# Patient Record
Sex: Female | Born: 1980 | Race: Black or African American | Hispanic: No | Smoking: Former smoker
Health system: Southern US, Community
[De-identification: ages and names within clinical notes are randomized; demographics above are authoritative.]

## PROBLEM LIST (undated history)

## (undated) DIAGNOSIS — D219 Benign neoplasm of connective and other soft tissue, unspecified: Secondary | ICD-10-CM

## (undated) DIAGNOSIS — K5790 Diverticulosis of intestine, part unspecified, without perforation or abscess without bleeding: Secondary | ICD-10-CM

---

## 2010-03-12 HISTORY — PX: CHOLECYSTECTOMY: SHX55

## 2018-03-12 HISTORY — PX: ABDOMINOPLASTY: SUR9

## 2020-03-02 ENCOUNTER — Ambulatory Visit: Payer: Self-pay | Admitting: Family Medicine

## 2020-03-02 ENCOUNTER — Encounter: Payer: Self-pay | Admitting: Family Medicine

## 2020-03-02 ENCOUNTER — Other Ambulatory Visit: Payer: Self-pay

## 2020-03-02 VITALS — BP 121/69 | HR 77 | Temp 97.8°F | Resp 16 | Ht 61.0 in

## 2020-03-02 DIAGNOSIS — Z7689 Persons encountering health services in other specified circumstances: Secondary | ICD-10-CM

## 2020-03-02 DIAGNOSIS — E559 Vitamin D deficiency, unspecified: Secondary | ICD-10-CM

## 2020-03-02 DIAGNOSIS — Z9189 Other specified personal risk factors, not elsewhere classified: Secondary | ICD-10-CM

## 2020-03-02 DIAGNOSIS — Z124 Encounter for screening for malignant neoplasm of cervix: Secondary | ICD-10-CM

## 2020-03-02 DIAGNOSIS — Z1231 Encounter for screening mammogram for malignant neoplasm of breast: Secondary | ICD-10-CM

## 2020-03-02 NOTE — Patient Instructions (Addendum)
Thank you for coming to the office today.  STay tuned for women's health referral to local OBGYN - [facility] [address] [place], [place] [address] Hours: [place] Main: [place]   Address: [address], [place], [place], [place],  [address] Hours: 8AM-5PM Phone: [phone]  Return tomorrow for non fasting lab for Vitamin D and COVID antibody testing.    DUE for FASTING BLOOD WORK no food or drink after midnight before the lab appointment, only water or coffee without cream/sugar on the morning of)  SCHEDULE "Lab Only" visit in the morning at the clinic for lab draw in 2-3* MONTHS   - Make sure Lab Only appointment is at about 1 week before your next appointment, so that results will be available  For Lab Results, once available within 2-3 days of blood draw, you can can log in to MyChart online to view your results and a brief explanation. Also, we can discuss results at next follow-up visit.     Please schedule a Follow-up Appointment to: Return in about 2 months (around 05/03/2020) for return 1 day non fasting lab, then 2-3 months fasting lab only then 1 week later Annual Physical.  If you have any other questions or concerns, please feel free to call the office or send a message through [place]. You may also schedule an earlier appointment if necessary.  Additionally, you may be receiving a survey about your experience at our office within a few days to 1 week by e-mail or mail. We value your feedback.  Nobie Putnam, DO [place]

## 2020-03-02 NOTE — Progress Notes (Signed)
Subjective:    Patient ID: Kristina Parks, female    DOB: 27-Jun-1980, 39 y.o.   MRN: [id number]  Kristina Parks is a 39 y.o. female presenting on 03/02/2020 for Establish Care and Vitamin D Deficiency  Relocated to [place] from [place] in March 2021  HPI   History of COVID Exposure She married in March 2021 and they moved to [place], and her mother lives in [place]. Her husband got COVID and passed in October 2021. - She did not get sick with COVID. She tested 3 x and was negative. She asks about antibody testing. 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  Vitamin D Deficiency Previous lab 12/2018 She took Vitamin D3 50,000 weekly for 3 months, now on daily  Health Maintenance:  Cervical Cancer Screening: Last pap smear about 6 years ago. Normal result. Never had abnormal. No fam history. She has not had any GYN procedure except tubal ligation  Breast CA Screening: Due for mammogram screening. Last mammogram result 1 year ago age 75 in [place] it was negative. No prior history abnormal mammogram. No known family history of breast cancer. Currently asymptomatic.  Due for Flu Shot, declines today despite counseling on benefits   Depression screen [facility] 2/9 03/02/2020  Decreased Interest 0  Down, Depressed, Hopeless 0  PHQ - 2 Score 0    History reviewed. No pertinent past medical history.  The histories are not reviewed yet. Please review them in the "History" navigator section and refresh this SmartLink. Social History   Socioeconomic History  . Marital status: Widowed    Spouse name: Not on file  . Number of children: Not on file  . Years of education: Not on file  . Highest education level: Not on file  Occupational History  . Not on file  Tobacco Use  . Smoking status: Former Games developer  . Smokeless tobacco: Former Engineer, water and Sexual Activity  . Alcohol use: Never  . Drug use: Not Currently    Types: Marijuana  . Sexual activity: Not on  file  Other Topics Concern  . Not on file  Social History Narrative  . Not on file   Social Determinants of Health   Financial Resource Strain: Not on file  Food Insecurity: Not on file  Transportation Needs: Not on file  Physical Activity: Not on file  Stress: Not on file  Social Connections: Not on file  Intimate Partner Violence: Not on file   Family History  Problem Relation Age of Onset  . Diabetes Sister   . Hypertension Sister   . Diabetes Maternal Grandmother   . Stroke Maternal Grandmother   . Hypertension Maternal Grandmother   . Cervical cancer Neg Hx   . Breast cancer Neg Hx   . Colon cancer Neg Hx    Current Outpatient Medications on File Prior to Visit  Medication Sig  . Cholecalciferol (VITAMIN D3) 50 MCG (2000 UT) TABS Take 2,000 Units by mouth daily.   No current facility-administered medications on file prior to visit.    Review of Systems Per HPI unless specifically indicated above      Objective:    BP 121/69   Pulse 77   Temp 97.8 F (36.6 C) (Temporal)   Resp 16   Ht 5\' 1"  (1.549 m)   SpO2 100%   Wt Readings from Last 3 Encounters:  No data found for Wt    Physical Exam Vitals and nursing note  reviewed.  Constitutional:      General: She is not in acute distress.    Appearance: She is well-developed and well-nourished. She is not diaphoretic.     Comments: Well-appearing, comfortable, cooperative  HENT:     Head: Normocephalic and atraumatic.     Mouth/Throat:     Mouth: Oropharynx is clear and moist.  Eyes:     General:        Right eye: No discharge.        Left eye: No discharge.     Conjunctiva/sclera: Conjunctivae normal.  Cardiovascular:     Rate and Rhythm: Normal rate.  Pulmonary:     Effort: Pulmonary effort is normal.  Musculoskeletal:        General: No edema.  Skin:    General: Skin is warm and dry.     Findings: No erythema or rash.  Neurological:     Mental Status: She is alert and oriented to person,  place, and time.  Psychiatric:        Mood and Affect: Mood and affect normal.        Behavior: Behavior normal.     Comments: Well groomed, good eye contact, normal speech and thoughts    No results found for this or any previous visit.    Assessment & Plan:   Problem List Items Addressed This Visit   None   Visit Diagnoses    Vitamin D deficiency    -  Primary   Relevant Orders   VITAMIN D 25 Hydroxy (Vit-D Deficiency, Fractures)   Cervical cancer screening       Relevant Orders   Ambulatory referral to Obstetrics / Gynecology   Encounter for screening mammogram for malignant neoplasm of breast       Relevant Orders   Ambulatory referral to Obstetrics / Gynecology   Encounter to establish care with new doctor       At increased risk of exposure to COVID-19 virus       Relevant Orders   SARS-CoV-2 Semi-Quantitative Total Antibody, Spike      #Referral to GYN locally for establish care routine women's health screening cervical cancer screening, now overdue, and breast cancer screening with mammogram.  #History of covid exposure Unvaccinated, but has been closely exposed in past. Request antibody level - Check quant antibody  #Vit D deficiency Treated in past, currently maintenance dosing Will check Lab with upcoming test  No orders of the defined types were placed in this encounter.   Follow up plan: Return in about 2 months (around 05/03/2020) for return 1 day non fasting lab, then 2-3 months fasting lab only then 1 week later Annual Physical.   Future orders 2-3 month after next result  Nobie Putnam, [facility] 03/02/2020, 3:25 PM

## 2020-03-03 ENCOUNTER — Encounter: Payer: Self-pay | Admitting: Family Medicine

## 2020-03-03 ENCOUNTER — Other Ambulatory Visit: Payer: Self-pay

## 2020-03-07 ENCOUNTER — Encounter: Payer: Self-pay | Admitting: Family Medicine

## 2020-03-07 ENCOUNTER — Other Ambulatory Visit: Payer: Self-pay | Admitting: Family Medicine

## 2020-03-07 DIAGNOSIS — Z0000 Encounter for general adult medical examination without abnormal findings: Secondary | ICD-10-CM

## 2020-03-07 DIAGNOSIS — Z1159 Encounter for screening for other viral diseases: Secondary | ICD-10-CM

## 2020-03-09 LAB — SARS-COV-2 SEMI-QUANTITATIVE TOTAL ANTIBODY, SPIKE: SARS COV2 AB, Total Spike Semi QN: 11.6 U/mL — ABNORMAL HIGH (ref <0.8)

## 2020-03-09 LAB — VITAMIN D 25 HYDROXY (VIT D DEFICIENCY, FRACTURES): Vit D, 25-Hydroxy: 32 ng/mL (ref 30–100)

## 2020-03-14 ENCOUNTER — Encounter: Payer: Self-pay | Admitting: Obstetrics and Gynecology

## 2020-03-15 ENCOUNTER — Encounter: Payer: Self-pay | Admitting: Obstetrics and Gynecology

## 2020-03-15 ENCOUNTER — Other Ambulatory Visit (HOSPITAL_COMMUNITY)
Admission: RE | Admit: 2020-03-15 | Discharge: 2020-03-15 | Disposition: A | Discharge status: Home / Self Care | Payer: Self-pay | Source: Ambulatory Visit | Attending: Obstetrics and Gynecology | Admitting: Obstetrics and Gynecology

## 2020-03-15 ENCOUNTER — Ambulatory Visit (INDEPENDENT_AMBULATORY_CARE_PROVIDER_SITE_OTHER): Payer: Self-pay | Admitting: Obstetrics and Gynecology

## 2020-03-15 ENCOUNTER — Other Ambulatory Visit: Payer: Self-pay

## 2020-03-15 VITALS — BP 114/72 | Ht 61.0 in | Wt 201.4 lb

## 2020-03-15 DIAGNOSIS — Z1239 Encounter for other screening for malignant neoplasm of breast: Secondary | ICD-10-CM

## 2020-03-15 DIAGNOSIS — R102 Pelvic and perineal pain: Secondary | ICD-10-CM

## 2020-03-15 DIAGNOSIS — Z124 Encounter for screening for malignant neoplasm of cervix: Secondary | ICD-10-CM | POA: Insufficient documentation

## 2020-03-15 DIAGNOSIS — Z7185 Encounter for immunization safety counseling: Secondary | ICD-10-CM

## 2020-03-15 DIAGNOSIS — Z01419 Encounter for gynecological examination (general) (routine) without abnormal findings: Secondary | ICD-10-CM

## 2020-03-15 DIAGNOSIS — Z1231 Encounter for screening mammogram for malignant neoplasm of breast: Secondary | ICD-10-CM

## 2020-03-15 NOTE — Progress Notes (Signed)
Pt thinks she needs a Pap.

## 2020-03-15 NOTE — Progress Notes (Signed)
Gynecology Exam  PCP: Olin Hauser, DO  Chief Complaint:  Chief Complaint  Patient presents with  . Gynecologic Exam    Due for pap/mammogram     History of Present Illness: Patient is a 40 y.o. [id number] presents as a referral for pap/mammogram from PCP. Patient also reports a LLQ pelvic pain that she associates with her menstrual cycles. Patient states the pain begins immediately following her cycle and lasts for a week at a time, limiting her physical activity and mobility. Patient describes the pain as sharp and cramping. Patient denies any factor that improve the pain. Patient reports she use to experience painful cramping with periods years ago. In the last ten years she reports regular, 28 day cycles, with 3-4 days of light menstruation.  LMP: Patient's last menstrual period was 02/19/2020. Average Interval: regular, 28 days Duration of flow: 4 days Heavy Menses: no Clots: no Intermenstrual Bleeding: no Postcoital Bleeding: no Dysmenorrhea: no   The patient is sexually active. She currently is not utilizing any form of contraception (has not in fourteen years). She denies dyspareunia.  The patient. There is no notable family history of breast or ovarian cancer in her family.  The patient has regular exercise: yes.  7 days a week, lifting and cycling.  The patient denies current symptoms of depression.    Review of Systems: ROS  Past Medical History:  There are no problems to display for this patient.   Past Surgical History:  Past Surgical History:  Procedure Laterality Date  . ABDOMINOPLASTY  2020  . [place]   2003, 2007    Gynecologic History:  Patient's last menstrual period was 02/19/2020. Contraception: none Last Pap: About five years ago - denies hx of abn paps   Last mammogram: 2020 Results were: per patient "negative"  Obstetric History: [id number]  Family History:  Family History  Problem Relation Age of Onset  .  Diabetes Sister   . Hypertension Sister   . Diabetes Maternal Grandmother   . Stroke Maternal Grandmother   . Hypertension Maternal Grandmother   . Cervical cancer Neg Hx   . Breast cancer Neg Hx   . Colon cancer Neg Hx     Social History:  Social History   Socioeconomic History  . Marital status: Widowed    Spouse name: Not on file  . Number of children: Not on file  . Years of education: Not on file  . Highest education level: Not on file  Occupational History  . Not on file  Tobacco Use  . Smoking status: Former Smoker    Packs/day: 1.00    Years: 10.00    Pack years: 10.00    Types: Cigarettes  . Smokeless tobacco: Former Network engineer and Sexual Activity  . Alcohol use: Never  . Drug use: Not Currently    Types: Marijuana  . Sexual activity: Not on file  Other Topics Concern  . Not on file  Social History Narrative  . Not on file   Social Determinants of Health   Financial Resource Strain: Not on file  Food Insecurity: Not on file  Transportation Needs: Not on file  Physical Activity: Not on file  Stress: Not on file  Social Connections: Not on file  Intimate Partner Violence: Not on file    Allergies:  No Known Allergies  Medications: Prior to Admission medications   Medication Sig Start Date End Date Taking? Authorizing Provider  Cholecalciferol (VITAMIN D3) 50 MCG (  2000 UT) TABS Take 2,000 Units by mouth daily.    [provider]    Physical Exam Vitals: Blood pressure 114/72, height 5\' 1"  (1.549 m), weight 201 lb 6.4 oz (91.4 kg), last menstrual period 02/19/2020.  General: NAD HEENT: normocephalic, anicteric Cardiovascular: RRR, distal pulses 2+ Breast: Breast symmetrical, no tenderness, no palpable nodules or masses, no skin or nipple retraction present, no nipple discharge.  No axillary or supraclavicular lymphadenopathy. Abdomen: NABS, soft, non-tender, non-distended.  Umbilicus without lesions.  No hepatomegaly, splenomegaly or  masses palpable. No evidence of hernia  Genitourinary:  External: Normal external female genitalia.  Normal urethral meatus, normal Bartholin's and Skene's glands.    Vagina: Normal vaginal mucosa, no evidence of prolapse.    Cervix: Grossly normal in appearance, no bleeding  Uterus: Non-enlarged, mobile, normal contour.  No CMT  Adnexa: ovaries non-enlarged, no adnexal masses  Rectal: deferred Extremities: no edema, erythema, or tenderness Neurologic: Grossly intact Psychiatric: mood appropriate, affect full  Assessment: 40 y.o. 44 as referral for pap and mammogram. Patient with menstrual cycle associated left-sided pelvic pain.   Plan: Problem List Items Addressed This Visit   None   Visit Diagnoses    Screening for cervical cancer    -  Primary   Relevant Orders   Cytology - PAP   Immunization counseling       Screening mammogram for breast cancer       Relevant Orders   MM DIGITAL SCREENING BILATERAL   Encounter for gynecological examination without abnormal finding       Screening breast examination       Pelvic pain in female       Relevant Orders   [id number] PELVIS TRANSVAGINAL NON-OB (TV ONLY)      1) Mammogram - recommend yearly screening mammogram.  Mammogram Was ordered today - patient is self-pay and states she will schedule when she can.  2) ASCCP guidelines and rational discussed.  Patient opts for every 5 years screening interval.  3) Routine healthcare maintenance including cholesterol, diabetes screening discussed managed by PCP - Patient recently established with new PCP.  4) Patient report of L sided pelvic pain following cycles - bimanual exam wnl, offered TVUS (patient is self and pay will schedule when desires). Discussed utilizing of scheduled NSAIDs starting two days before the end of her menstural cycle and continuing for 72 hours to see if pain is relieved.    Return for Schedule pelvic US.  [place], CNM, MSN [facility], [facility] 03/15/2020, 10:41 AM

## 2020-03-18 LAB — CYTOLOGY - PAP
Comment: NEGATIVE
Diagnosis: NEGATIVE
High risk HPV: NEGATIVE

## 2020-04-15 ENCOUNTER — Ambulatory Visit: Payer: Self-pay

## 2020-04-15 DIAGNOSIS — R102 Pelvic and perineal pain: Secondary | ICD-10-CM

## 2020-04-22 ENCOUNTER — Ambulatory Visit: Payer: Self-pay | Admitting: Obstetrics and Gynecology

## 2020-04-22 ENCOUNTER — Ambulatory Visit: Payer: Self-pay

## 2020-06-06 ENCOUNTER — Other Ambulatory Visit: Payer: Self-pay | Admitting: *Deleted

## 2020-06-06 DIAGNOSIS — Z1159 Encounter for screening for other viral diseases: Secondary | ICD-10-CM

## 2020-06-06 DIAGNOSIS — Z0000 Encounter for general adult medical examination without abnormal findings: Secondary | ICD-10-CM

## 2020-06-07 ENCOUNTER — Other Ambulatory Visit: Payer: Self-pay

## 2020-06-14 ENCOUNTER — Encounter: Payer: Self-pay | Admitting: Family Medicine

## 2020-08-30 ENCOUNTER — Ambulatory Visit (INDEPENDENT_AMBULATORY_CARE_PROVIDER_SITE_OTHER): Payer: Self-pay | Admitting: Podiatry

## 2020-08-30 ENCOUNTER — Other Ambulatory Visit: Payer: Self-pay

## 2020-08-30 DIAGNOSIS — Z79899 Other long term (current) drug therapy: Secondary | ICD-10-CM

## 2020-08-30 DIAGNOSIS — B351 Tinea unguium: Secondary | ICD-10-CM

## 2020-08-31 ENCOUNTER — Encounter: Payer: Self-pay | Admitting: Podiatry

## 2020-08-31 LAB — HEPATIC FUNCTION PANEL
ALT: 9 IU/L (ref 0–32)
AST: 16 IU/L (ref 0–40)
Albumin: 4.1 g/dL (ref 3.8–4.8)
Alkaline Phosphatase: 43 IU/L — ABNORMAL LOW (ref 44–121)
Bilirubin Total: 0.4 mg/dL (ref 0.0–1.2)
Bilirubin, Direct: 0.11 mg/dL (ref 0.00–0.40)
Total Protein: 6.6 g/dL (ref 6.0–8.5)

## 2020-08-31 MED ORDER — TERBINAFINE HCL 250 MG PO TABS: 250.0000 mg | ORAL_TABLET | Freq: Every day | ORAL | 0 refills | Status: DC

## 2020-08-31 NOTE — Progress Notes (Signed)
  Subjective:  Patient ID: Kristina Parks, female    DOB: Jul 17, 1980,  MRN: [id number]  Chief Complaint  Patient presents with   Nail Problem    Nail discoloration     40 y.o. female presents with the above complaint.  Patient presents with complaint of thickened elongated dystrophic toenails x10.  Painful to touch.  She would like to discuss treatment for fungus.  She has not tried anything in the past.  She has not seen anyone else prior to seeing me.  She denies any liver history.  She would like to discuss treatment options for this.  They are painful sometimes is that thick they are harder to cut.   Review of Systems: Negative except as noted in the HPI. Denies N/V/F/Ch.  No past medical history on file.  Current Outpatient Medications:    terbinafine (LAMISIL) 250 MG tablet, Take 1 tablet (250 mg total) by mouth daily., Disp: 90 tablet, Rfl: 0   Cholecalciferol (VITAMIN D3) 50 MCG (2000 UT) TABS, Take 2,000 Units by mouth daily., Disp: , Rfl:   Social History   Tobacco Use  Smoking Status Former   Packs/day: 1.00   Years: 10.00   Pack years: 10.00   Types: Cigarettes  Smokeless Tobacco Former    No Known Allergies Objective:  There were no vitals filed for this visit. There is no height or weight on file to calculate BMI. Constitutional Well developed. Well nourished.  Vascular Dorsalis pedis pulses palpable bilaterally. Posterior tibial pulses palpable bilaterally. Capillary refill normal to all digits.  No cyanosis or clubbing noted. Pedal hair growth normal.  Neurologic Normal speech. Oriented to person, place, and time. Epicritic sensation to light touch grossly present bilaterally.  Dermatologic Nails thickened elongated dystrophic mycotic toenails x10.  Mild pain on palpation Skin within normal limits  Orthopedic: Normal joint ROM without pain or crepitus bilaterally. No visible deformities. No bony tenderness.   Radiographs: None Assessment:   1.  Long-term use of high-risk medication   2. Nail fungus   3. Onychomycosis due to dermatophyte    Plan:  Patient was evaluated and treated and all questions answered.  Bilateral onychomycosis x10 -Educated the patient on the etiology of onychomycosis and various treatment options associated with improving the fungal load.  I explained to the patient that there is 3 treatment options available to treat the onychomycosis including topical, p.o., laser treatment.  Patient elected to undergo p.o. options with Lamisil/terbinafine therapy.  In order for me to start the medication therapy, I explained to the patient the importance of evaluating the liver and obtaining the liver function test.  Once the liver function test comes back normal I will start him on 72-month course of Lamisil therapy.  Patient understood all risk and would like to proceed with Lamisil therapy.  I have asked the patient to immediately stop the Lamisil therapy if she has any reactions to it and call the office or go to the emergency room right away.  Patient states understanding   No follow-ups on file.

## 2020-09-02 ENCOUNTER — Telehealth: Payer: Self-pay | Admitting: *Deleted

## 2020-09-02 MED ORDER — TERBINAFINE HCL 250 MG PO TABS: 250.0000 mg | ORAL_TABLET | Freq: Every day | ORAL | 0 refills | Status: DC

## 2020-09-02 NOTE — Telephone Encounter (Signed)
"  I'm calling about my prescription for Lamisil.  I want it to be sent to [facility] over on [address].  It's very expensive at [facility] and much cheaper at [facility], more affordable.  So if you would, let me know when that's been done."  I'm calling to let you know that I sent your prescription for Lamisil to [place].  "Thank you so much."

## 2020-09-05 ENCOUNTER — Telehealth: Payer: Self-pay

## 2020-09-05 NOTE — Telephone Encounter (Signed)
Patient called today and left a message at 12:34 pm. She is having a reaction to the Lamisil. Yesterday she had some itching all over. Today the skin on her lips is peeling. Called and spoke with patient - advised to NOT take anymore Lamisil. Advised to take Benadryl if itching and peeling persisted. Patient asked if there is something topical she can use on her toenails instead.

## 2020-09-06 MED ORDER — CICLOPIROX 8 % EX SOLN: Freq: Every day | CUTANEOUS | 0 refills | Status: DC

## 2020-09-06 NOTE — Addendum Note (Signed)
Addended by: Boneta Lucks on: 09/06/2020 07:58 AM   Modules accepted: Orders

## 2020-11-28 ENCOUNTER — Telehealth: Payer: Self-pay | Admitting: Obstetrics & Gynecology

## 2020-11-28 NOTE — Telephone Encounter (Signed)
Kristina Parks placed an order for this pt to have an [place] on 03/15/20.  She was advised to try taking Tylenol, Advil, etc. To help with the pain and maybe the [place] would not be needed. She has decided now that she does need the [place].  Can you please place an order for an [place]?  Or would she need to be seen first?  Please advise.

## 2020-11-29 ENCOUNTER — Other Ambulatory Visit: Payer: Self-pay | Admitting: Obstetrics & Gynecology

## 2020-11-29 DIAGNOSIS — R102 Pelvic and perineal pain: Secondary | ICD-10-CM

## 2020-11-29 NOTE — Telephone Encounter (Signed)
Sch [place] then a follow up with one of the MD's. Order placed.

## 2020-12-06 ENCOUNTER — Other Ambulatory Visit: Payer: Self-pay

## 2020-12-06 ENCOUNTER — Ambulatory Visit
Admission: RE | Admit: 2020-12-06 | Discharge: 2020-12-06 | Disposition: A | Discharge status: Home / Self Care | Payer: Self-pay | Source: Ambulatory Visit | Attending: Obstetrics & Gynecology | Admitting: Obstetrics & Gynecology

## 2020-12-06 DIAGNOSIS — R102 Pelvic and perineal pain: Secondary | ICD-10-CM | POA: Insufficient documentation

## 2020-12-06 DIAGNOSIS — R1032 Left lower quadrant pain: Secondary | ICD-10-CM | POA: Diagnosis not present

## 2020-12-06 DIAGNOSIS — G8929 Other chronic pain: Secondary | ICD-10-CM | POA: Diagnosis not present

## 2020-12-12 ENCOUNTER — Other Ambulatory Visit: Payer: Self-pay

## 2020-12-12 ENCOUNTER — Ambulatory Visit (INDEPENDENT_AMBULATORY_CARE_PROVIDER_SITE_OTHER): Payer: Self-pay | Admitting: Obstetrics & Gynecology

## 2020-12-12 ENCOUNTER — Encounter: Payer: Self-pay | Admitting: Obstetrics & Gynecology

## 2020-12-12 VITALS — BP 120/80 | Ht 61.0 in | Wt 201.0 lb

## 2020-12-12 DIAGNOSIS — D252 Subserosal leiomyoma of uterus: Secondary | ICD-10-CM

## 2020-12-12 DIAGNOSIS — D219 Benign neoplasm of connective and other soft tissue, unspecified: Secondary | ICD-10-CM

## 2020-12-12 DIAGNOSIS — R102 Pelvic and perineal pain: Secondary | ICD-10-CM

## 2020-12-12 NOTE — Progress Notes (Signed)
HPI: Patient is a 40 yo G3P3 AA F, presents with recent US due to pelvic paiin and noted to have a uterine fibroid. Periods are regular every 28-30 days, lasting several days. Dysmenorrhea:moderate, occurring premenstrually and first 1-2 days of flow. Cyclic symptoms include none. No intermenstrual bleeding, spotting, or discharge.  She has had pain for about a year, mostly LLQ, worse w activity, menses, and sex.  She is a regular with exercise and yoga.  She has lost 150 lbs and continues to maintain weight loss w activity and diet.  The pain gets in the way of this routine at times.  Ultrasound demonstrates a small 2 cm mid uterine subserosal fibroid (see below, non-descript)  PMHx: She  has no past medical history on file. Also,  has a past surgical history that includes Cesarean section (1999) and Abdominoplasty (2020)., family history includes Diabetes in her maternal grandmother and sister; Hypertension in her maternal grandmother and sister; Stroke in her maternal grandmother.,  reports that she has quit smoking. Her smoking use included cigarettes. She has a 10.00 pack-year smoking history. She has quit using smokeless tobacco. She reports that she does not currently use drugs after having used the following drugs: Marijuana. She reports that she does not drink alcohol.  She has a current medication list which includes the following prescription(s): vitamin d3, ciclopirox, and terbinafine. Also, has No Known Allergies.  Review of Systems  Constitutional:  Negative for chills, fever and malaise/fatigue.  HENT:  Negative for congestion, sinus pain and sore throat.   Eyes:  Negative for blurred vision and pain.  Respiratory:  Negative for cough and wheezing.   Cardiovascular:  Negative for chest pain and leg swelling.  Gastrointestinal:  Positive for abdominal pain. Negative for constipation, diarrhea, heartburn, nausea and vomiting.  Genitourinary:  Negative for dysuria, frequency, hematuria  and urgency.  Musculoskeletal:  Negative for back pain, joint pain, myalgias and neck pain.  Skin:  Negative for itching and rash.  Neurological:  Negative for dizziness, tremors and weakness.  Endo/Heme/Allergies:  Does not bruise/bleed easily.  Psychiatric/Behavioral:  Negative for depression. The patient is not nervous/anxious and does not have insomnia.    Objective: BP 120/80   Ht 5\' 1"  (1.549 m)   Wt 201 lb (91.2 kg)   LMP 11/18/2020   BMI 37.98 kg/m   Physical examination Constitutional NAD, Conversant  Skin No rashes, lesions or ulceration.   Extremities: Moves all appropriately.  Normal ROM for age. No lymphadenopathy.  Neuro: Grossly intact  Psych: Oriented to PPT.  Normal mood. Normal affect.   TRANSABDOMINAL AND TRANSVAGINAL ULTRASOUND OF PELVIS   TECHNIQUE: Both transabdominal and transvaginal ultrasound examinations of the pelvis were performed. Transabdominal technique was performed for global imaging of the pelvis including uterus, ovaries, adnexal regions, and pelvic cul-de-sac. It was necessary to proceed with endovaginal exam following the transabdominal exam to visualize the adnexa.   COMPARISON:  None   FINDINGS: Uterus   Measurements: 11.6 x 5.4 x 6.0 cm = volume: 97 mL. Anteverted. Heterogeneous myometrium. No definite discrete mass. An area of slight contour abnormality at the anterior aspect of the mid uterus is seen, cannot completely exclude a subserosal 1.9 cm diameter leiomyoma.   Endometrium   Thickness: 5 mm.  No endometrial fluid or mass   Right ovary   Measurements: 3.1 x 2.2 x 2.5 cm = volume: 9.1 mL. Normal morphology without mass   Left ovary   Measurements: 2.8 x 2.4 x 2.1 cm =  volume: 7.4 mL. Normal morphology without mass   Other findings   Trace free pelvic fluid.  No adnexal masses.   IMPRESSION: Questionable 1.9 cm diameter anterior wall subserosal leiomyoma at mid uterus versus lobulated uterine contour.    Remainder of exam unremarkable.  Assessment: 1. Pelvic pain in female 2. Fibroids 3. Pelvic and perineal pain 4. Subserous leiomyoma of uterus - MR PELVIS WO CONTRAST; Future  Fibroid treatment such as [place], Lupron, Myomectomy, and Hysterectomy discussed in detail, with the pros and cons of each choice counseled.  No treatment as an option also discussed, as well as control of symptoms alone with hormone therapy. Information provided to the patient.  MRI to better characterize fibroid as well as surrounding structures.  Consider PT as an option to alleviate musculoskeletal component to her chronic LLQ pain  As fibroid is small, difficult to ascertain if it is the sole etiology for her pain.  May contribute, as uterus itself may contribute as well. Discussed that surgery such as myomectomy or hysterectomy may or may not solve her pain symptoms.  Reassured that no ovarian cysts or pathology seen  A total of 30 minutes were spent face-to-face with the patient as well as preparation, review, communication, and documentation during this encounter.   Barnett Applebaum, MD, [facility], [facility] 12/12/2020  2:04 PM

## 2020-12-12 NOTE — Patient Instructions (Signed)
Uterine Fibroids ?Uterine fibroids, also called leiomyomas, are noncancerous (benign) tumors that can grow in the uterus. They can cause heavy menstrual bleeding and pain. Fibroids may also grow in the fallopian tubes, cervix, or tissues (ligaments) near the uterus. ?You may have one or many fibroids. Fibroids vary in size, weight, and where they grow in the uterus. Some can become quite large. Most fibroids do not require medical treatment. ?What are the causes? ?The cause of this condition is not known. ?What increases the risk? ?You are more likely to develop this condition if you: ?Are in your 30s or 40s and have not gone through menopause. ?Have a family history of this condition. ?Are of African American descent. ?Started your menstrual period at age 10 or younger. ?Have never given birth. ?Are overweight or obese. ?What are the signs or symptoms? ?Many women do not have any symptoms. Symptoms of this condition may include: ?Heavy menstrual bleeding. ?Bleeding between menstrual periods. ?Pain and pressure in the pelvic area, between your hip bones. ?Pain during sex. ?Bladder problems, such as needing to urinate right away or more often than usual. ?Inability to have children (infertility). ?Failure to carry pregnancy to term (miscarriage). ?How is this diagnosed? ?This condition may be diagnosed based on: ?Your symptoms and medical history. ?A physical exam. ?A pelvic exam that includes feeling for any tumors. ?Imaging tests, such as ultrasound or MRI. ?How is this treated? ?Treatment for this condition may include follow-up visits with your health care provider to monitor your fibroids for any changes. Other treatment may include: ?Medicines, such as: ?Medicines to relieve pain, including aspirin and NSAIDs, such as ibuprofen or naproxen. ?Hormone therapy. Treatment may be given as a pill or an injection, or it may be inserted into the uterus using an intrauterine device (IUD). ?Surgery that would do one of  the following: ?Remove the fibroids (myomectomy). This may be recommended if fibroids affect your fertility and you want to become pregnant. ?Remove the uterus (hysterectomy). ?Block the blood supply to the fibroids (uterine artery embolization). This can cause them to shrink and die. ?Follow these instructions at home: ?Medicines ?Take over-the-counter and prescription medicines only as told by your health care provider. ?Ask your health care provider if you should take iron pills or eat more iron-rich foods, such as dark green, leafy vegetables. Heavy menstrual bleeding can cause low iron levels. ?Managing pain ?If directed, apply heat to your back or abdomen to reduce pain. Use the heat source that your health care provider recommends, such as a moist heat pack or a heating pad. To apply heat: ?Place a towel between your skin and the heat source. ?Leave the heat on for 20-30 minutes. ?Remove the heat if your skin turns bright red. This is especially important if you are unable to feel pain, heat, or cold. You may have a greater risk of getting burned. ? ?General instructions ?Pay close attention to your menstrual cycle. Tell your health care provider about any changes, such as: ?Heavier bleeding that requires you to change your pads or tampons more than usual. ?A change in the number of days that your menstrual period lasts. ?A change in symptoms that come with your menstrual period, such as back pain or cramps in your abdomen. ?Keep all follow-up visits. This is important, especially if your fibroids need to be monitored for any changes. ?Contact a health care provider if you: ?Have pelvic pain, back pain, or cramps in your abdomen that do not get better with medicine   or heat. ?Develop new bleeding between menstrual periods. ?Have increased bleeding during or between menstrual periods. ?Feel more tired or weak than usual. ?Feel light-headed. ?Get help right away if you: ?Faint. ?Have pelvic pain that suddenly  gets worse. ?Have severe vaginal bleeding that soaks a tampon or pad in 30 minutes or less. ?Summary ?Uterine fibroids are noncancerous (benign) tumors that can develop in the uterus. ?The exact cause of this condition is not known. ?Most fibroids do not require medical treatment unless they affect your ability to have children (fertility). ?Contact a health care provider if you have pelvic pain, back pain, or cramps in your abdomen that do not get better with medicines. ?Get help right away if you faint, have pelvic pain that suddenly gets worse, or have severe vaginal bleeding. ?This information is not intended to replace advice given to you by your health care provider. Make sure you discuss any questions you have with your health care provider. ?Document Revised: 09/29/2019 Document Reviewed: 09/29/2019 ?Elsevier Patient Education ? 2022 Elsevier Inc. ? ?

## 2020-12-18 ENCOUNTER — Ambulatory Visit: Admission: RE | Admit: 2020-12-18 | Payer: Self-pay | Source: Ambulatory Visit

## 2020-12-19 ENCOUNTER — Ambulatory Visit
Admission: RE | Admit: 2020-12-19 | Discharge: 2020-12-19 | Disposition: A | Discharge status: Home / Self Care | Payer: Medicaid Other | Source: Ambulatory Visit | Attending: Obstetrics & Gynecology | Admitting: Obstetrics & Gynecology

## 2020-12-19 ENCOUNTER — Other Ambulatory Visit: Payer: Self-pay

## 2020-12-19 ENCOUNTER — Ambulatory Visit: Payer: Self-pay | Admitting: Obstetrics & Gynecology

## 2020-12-19 DIAGNOSIS — R102 Pelvic and perineal pain: Secondary | ICD-10-CM | POA: Insufficient documentation

## 2020-12-19 DIAGNOSIS — D252 Subserosal leiomyoma of uterus: Secondary | ICD-10-CM | POA: Diagnosis not present

## 2020-12-19 DIAGNOSIS — K5730 Diverticulosis of large intestine without perforation or abscess without bleeding: Secondary | ICD-10-CM | POA: Diagnosis not present

## 2020-12-19 DIAGNOSIS — N858 Other specified noninflammatory disorders of uterus: Secondary | ICD-10-CM | POA: Diagnosis not present

## 2020-12-19 MED ORDER — GADOBUTROL 1 MMOL/ML IV SOLN
9.0000 mL | Freq: Once | INTRAVENOUS | Status: AC | PRN
  Administered 2020-12-19: 9 mL via INTRAVENOUS

## 2020-12-26 ENCOUNTER — Ambulatory Visit: Payer: Self-pay | Admitting: Obstetrics & Gynecology

## 2021-01-03 ENCOUNTER — Ambulatory Visit: Payer: Self-pay | Admitting: Podiatry

## 2021-05-09 ENCOUNTER — Ambulatory Visit
Admission: EM | Admit: 2021-05-09 | Discharge: 2021-05-09 | Disposition: A | Discharge status: Home / Self Care | Payer: Medicaid Other | Attending: Emergency Medicine | Admitting: Emergency Medicine

## 2021-05-09 ENCOUNTER — Encounter: Payer: Self-pay | Admitting: Emergency Medicine

## 2021-05-09 ENCOUNTER — Other Ambulatory Visit: Payer: Self-pay

## 2021-05-09 DIAGNOSIS — J0100 Acute maxillary sinusitis, unspecified: Secondary | ICD-10-CM

## 2021-05-09 DIAGNOSIS — Z20822 Contact with and (suspected) exposure to covid-19: Secondary | ICD-10-CM | POA: Diagnosis not present

## 2021-05-09 HISTORY — DX: Benign neoplasm of connective and other soft tissue, unspecified: D21.9

## 2021-05-09 HISTORY — DX: Diverticulosis of intestine, part unspecified, without perforation or abscess without bleeding: K57.90

## 2021-05-09 MED ORDER — FLUCONAZOLE 150 MG PO TABS: 150.0000 mg | ORAL_TABLET | Freq: Once | ORAL | 1 refills | Status: AC

## 2021-05-09 MED ORDER — AMOXICILLIN-POT CLAVULANATE 875-125 MG PO TABS: 1.0000 | ORAL_TABLET | Freq: Two times a day (BID) | ORAL | 0 refills | Status: DC

## 2021-05-09 MED ORDER — FLUTICASONE PROPIONATE 50 MCG/ACT NA SUSP: 2.0000 | Freq: Every day | NASAL | 0 refills | Status: DC

## 2021-05-09 NOTE — ED Provider Notes (Signed)
.HPI  SUBJECTIVE:  Kristina Parks is a 41 y.o. female who presents with 2 days of yellowish-green rhinorrhea, nasal congestion, bilateral ear pain, worse on the right, and decreased hearing.  She reports sinus pain and pressure, postnasal drip, sore throat secondary to postnasal drip and mild facial swelling/puffiness around her eyes.  States that her sense of taste is "off".  No change in her sense of smell.  No fevers, body aches, headaches, cough, shortness of breath, nausea, vomiting, diarrhea, abdominal pain.  No known COVID or flu exposure.  She did not get the COVID or flu vaccines.  She grinds her teeth at night and has a bite guard.  No recent swimming.  She uses ear buds occasionally.  No antipyretic in the past 6 hours.  She has been taking Tylenol and ibuprofen "around-the-clock" and took Benadryl last night for the nasal congestion without improvement in her symptoms.  Symptoms are worse when she tries to wear her bite guard, and with lying flat.  No antibiotics in the past month.  She has a past medical history of COVID in October 21 but no fibroids.  No history of TMJ arthralgia, frequent otitis media or sinusitis.  LMP: 1 week ago.  Denies possibility of being pregnant.  PCP: [facility]    Past Medical History:  Diagnosis Date   Diverticulosis    Fibroids     Past Surgical History:  Procedure Laterality Date   ABDOMINOPLASTY  2020   CESAREAN SECTION  1999   2003, 2007    Family History  Problem Relation Age of Onset   Diabetes Sister    Hypertension Sister    Diabetes Maternal Grandmother    Stroke Maternal Grandmother    Hypertension Maternal Grandmother    Cervical cancer Neg Hx    Breast cancer Neg Hx    Colon cancer Neg Hx     Social History   Tobacco Use   Smoking status: Former    Packs/day: 1.00    Years: 10.00    Pack years: 10.00    Types: Cigarettes   Smokeless tobacco: Former  Scientific laboratory technician Use: Never used  Substance Use  Topics   Alcohol use: Never   Drug use: Not Currently    Types: Marijuana    No current facility-administered medications for this encounter.  Current Outpatient Medications:    amoxicillin-clavulanate (AUGMENTIN) 875-125 MG tablet, Take 1 tablet by mouth 2 (two) times daily. X 7 days, Disp: 14 tablet, Rfl: 0   fluconazole (DIFLUCAN) 150 MG tablet, Take 1 tablet (150 mg total) by mouth once for 1 dose. 1 tab po x 1. May repeat in 72 hours if no improvement, Disp: 2 tablet, Rfl: 1   fluticasone (FLONASE) 50 MCG/ACT nasal spray, Place 2 sprays into both nostrils daily., Disp: 16 g, Rfl: 0  Allergies  Allergen Reactions   Lamisil [Terbinafine] Other (See Comments)    Skin peels     ROS  As noted in HPI.   Physical Exam  BP 117/78 (BP Location: Left Arm)    Pulse 80    Temp 98.3 F (36.8 C) (Oral)    Resp 18    LMP 05/02/2021    SpO2 98%   Constitutional: Well developed, well nourished, no acute distress Eyes:  EOMI, conjunctiva normal bilaterally HENT: Normocephalic, atraumatic,mucus membranes moist.  Hearing grossly intact and equal bilaterally.  Bilateral external ears, EACs TMs normal.  No pain with traction on pinna, palpation of  mastoid bilaterally.  Tenderness to palpation of right tragus.  No TMJ tenderness, jaw crepitus bilaterally.  Positive purulent nasal congestion.  Erythematous, swollen turbinates.  Positive maxillary sinus tenderness.  Positive postnasal drip. Neck: Shotty cervical lymphadenopathy. Respiratory: Normal inspiratory effort Cardiovascular: Normal rate GI: nondistended skin: No rash, skin intact Musculoskeletal: no deformities Neurologic: Alert & oriented x 3, no focal neuro deficits Psychiatric: Speech and behavior appropriate   ED Course   Medications - No data to display  Orders Placed This Encounter  Procedures   Novel Coronavirus, NAA ([facility])    Standing Status:   Standing    Number of Occurrences:   1    No results found for this or  any previous visit (from the past 24 hour(s)). No results found.  ED Clinical Impression  1. Acute non-recurrent maxillary sinusitis   2. Encounter for laboratory testing for COVID-19 virus      ED Assessment/Plan  Presentation consistent with maxillary sinusitis.  She has no evidence of an otitis media.  Not appear to be TMJ arthralgia.  Suspect the ear pain is coming from the nasal congestion.  Checking COVID as patient will qualify for antivirals because she is not vaccinated.  Will prescribe Molnupiravir if COVID is positive.  In the meantime, Flonase, saline nasal irrigation, Mucinex D.  She has soft indications for antibiotics- upper dental pain.  Wait-and-see prescription of Augmentin if not getting better with the other medications.  Diflucan because she gets frequent yeast infections with antibiotics.  Discussed labs,MDM, treatment plan, and plan for follow-up with patient. patient agrees with plan.   Meds ordered this encounter  Medications   fluticasone (FLONASE) 50 MCG/ACT nasal spray    Sig: Place 2 sprays into both nostrils daily.    Dispense:  16 g    Refill:  0   amoxicillin-clavulanate (AUGMENTIN) 875-125 MG tablet    Sig: Take 1 tablet by mouth 2 (two) times daily. X 7 days    Dispense:  14 tablet    Refill:  0   fluconazole (DIFLUCAN) 150 MG tablet    Sig: Take 1 tablet (150 mg total) by mouth once for 1 dose. 1 tab po x 1. May repeat in 72 hours if no improvement    Dispense:  2 tablet    Refill:  1      *This clinic note was created using [employer]. Therefore, there may be occasional mistakes despite careful proofreading.  ?    Melynda Ripple, MD 05/09/21 1454

## 2021-05-09 NOTE — ED Triage Notes (Signed)
Pt c/o bilateral ear pain and congestion x 3 days

## 2021-05-09 NOTE — Discharge Instructions (Addendum)
Start Mucinex-D to keep the mucous thin and to decongest you.   You may take 600 mg of motrin with 1000 mg of tylenol up to 3-4 times a day as needed for pain. This is an effective combination for pain.  Most sinus infections are viral and do not need antibiotics unless you have a high fever, have had this for 10 days, or you get better and then get sick again. Use a NeilMed sinus rinse with distilled water as often as you want to to reduce nasal congestion. Follow the directions on the box.   I will prescribe Molnupiravir if your COVID comes back positive.  You will get the results through [facility] or you can call here and get the result in a day or 2.   Go to www.goodrx.com to look up your medications. This will give you a list of where you can find your prescriptions at the most affordable prices. Or you can ask the pharmacist what the cash price is. This is frequently cheaper than going through insurance.

## 2021-05-11 LAB — NOVEL CORONAVIRUS, NAA: SARS-CoV-2, NAA: NOT DETECTED

## 2022-01-04 DIAGNOSIS — N898 Other specified noninflammatory disorders of vagina: Secondary | ICD-10-CM | POA: Diagnosis not present

## 2022-01-05 ENCOUNTER — Ambulatory Visit: Payer: Medicaid Other

## 2022-01-06 DIAGNOSIS — N390 Urinary tract infection, site not specified: Secondary | ICD-10-CM | POA: Diagnosis not present

## 2022-01-06 DIAGNOSIS — R350 Frequency of micturition: Secondary | ICD-10-CM | POA: Diagnosis not present

## 2022-03-01 ENCOUNTER — Ambulatory Visit: Payer: Self-pay | Admitting: Family Medicine

## 2022-03-07 ENCOUNTER — Ambulatory Visit (INDEPENDENT_AMBULATORY_CARE_PROVIDER_SITE_OTHER): Payer: Medicaid Other | Admitting: Family Medicine

## 2022-03-07 ENCOUNTER — Encounter: Payer: Self-pay | Admitting: Family Medicine

## 2022-03-07 VITALS — BP 102/70 | HR 87 | Resp 15 | Ht 61.0 in | Wt 181.6 lb

## 2022-03-07 DIAGNOSIS — Z83438 Family history of other disorder of lipoprotein metabolism and other lipidemia: Secondary | ICD-10-CM

## 2022-03-07 DIAGNOSIS — L658 Other specified nonscarring hair loss: Secondary | ICD-10-CM | POA: Diagnosis not present

## 2022-03-07 DIAGNOSIS — L659 Nonscarring hair loss, unspecified: Secondary | ICD-10-CM

## 2022-03-07 DIAGNOSIS — Z0000 Encounter for general adult medical examination without abnormal findings: Secondary | ICD-10-CM

## 2022-03-07 DIAGNOSIS — E559 Vitamin D deficiency, unspecified: Secondary | ICD-10-CM | POA: Diagnosis not present

## 2022-03-07 DIAGNOSIS — K5900 Constipation, unspecified: Secondary | ICD-10-CM

## 2022-03-07 DIAGNOSIS — Z833 Family history of diabetes mellitus: Secondary | ICD-10-CM | POA: Diagnosis not present

## 2022-03-07 DIAGNOSIS — Z131 Encounter for screening for diabetes mellitus: Secondary | ICD-10-CM | POA: Diagnosis not present

## 2022-03-07 DIAGNOSIS — Z13220 Encounter for screening for lipoid disorders: Secondary | ICD-10-CM | POA: Diagnosis not present

## 2022-03-07 DIAGNOSIS — Z1231 Encounter for screening mammogram for malignant neoplasm of breast: Secondary | ICD-10-CM | POA: Diagnosis not present

## 2022-03-07 DIAGNOSIS — E669 Obesity, unspecified: Secondary | ICD-10-CM | POA: Diagnosis not present

## 2022-03-07 NOTE — Patient Instructions (Addendum)
Thank you for coming to the office today.  For Constipation (less frequent bowel movement that can be hard dry or involve straining).  Recommend trying OTC Miralax 17g = 1 capful in large glass water once daily for now, try several days to see if working, goal is soft stool or BM 1-2 times daily, if too loose then reduce dose or try every other day. If not effective may need to increase it to 2 doses at once in AM or may do 1 in morning and 1 in afternoon/evening  - This medicine is very safe and can be used often without any problem and will not make you dehydrated. It is good for use on AS NEEDED BASIS or even MAINTENANCE therapy for longer term for several days to weeks at a time to help regulate bowel movements  Other more natural remedies or preventative treatment: - Increase hydration with water - Increase fiber in diet (high fiber foods = vegetables, leafy greens, oats/grains) - May take OTC Fiber supplement (metamucil powder or pill/gummy) - May try OTC Probiotic  --------------  Lab orders today.  For Mammogram screening for breast cancer   Call the [facility] below anytime to schedule your own appointment now that order has been placed.  [facility] Out[facility] Radiology [address] [place], [place] [address] Phone: [phone]  [facility] [place], [address] [place], [place] [address] Phone: [phone]  Referral submitted  [facility]   [place], [place] [address] Hours: 8AM-5PM Phone: [phone]  ----------  [facility] Dermatologist in [place], [place] Address: [address], [place], [place] [address] Phone: [phone]  [facility] [place], [place] [address] Phone: [phone]    DUE for FASTING BLOOD WORK (no food or drink after midnight before the lab appointment, only water or coffee without  cream/sugar on the morning of)  SCHEDULE "Lab Only" visit in the morning at the clinic for lab draw in 1 YEAR  - Make sure Lab Only appointment is at about 1 week before your next appointment, so that results will be available  For Lab Results, once available within 2-3 days of blood draw, you can can log in to MyChart online to view your results and a brief explanation. Also, we can discuss results at next follow-up visit.   Please schedule a Follow-up Appointment to: Return in about 1 year (around 03/08/2023) for 1 year Annual Physical AM apt fasting lab after.  If you have any other questions or concerns, please feel free to call the office or send a message through [facility]. You may also schedule an earlier appointment if necessary.  Additionally, you may be receiving a survey about your experience at our office within a few days to 1 week by e-mail or mail. We value your feedback.  Nobie Putnam, DO [facility]

## 2022-03-07 NOTE — Progress Notes (Signed)
Subjective:    Patient ID: Kristina Parks, female    DOB: [birth year], 41 y.o.   MRN: [id number]  Kristina Parks is a 41 y.o. female presenting on 03/07/2022 for Annual Exam and Constipation   HPI  Here for Annual Physical and Lab orders fasting today. Last visit with me 2021, now re-establishing care   BMI >34 Lifestyle She has improved diet, now following anti inflammatory diet and has had success with 20 lb weight loss in past 1 year. Exercise regimen with strength training, walking, cyclist  Constipation She has improved fiber intake, added daily mag citrate. Taking Probiotic. She feels bloating and some lower abdominal cramping and indigestion Bowel movements are smaller pieces, sometimes harder dry firm Usually week before her menstrual cycle will go several days without BM, then will continually nursing the constipation, seems 1 week will have successful BMs then will have 2-3 weeks with difficulty. She admits this is a longer term problem for her. She will use OTC Castor Oil to help resolve the constipation.  Fam history of Diabetes, HLD, Thyroid She is requesting screening labs  Followed by [facility]  Health Maintenance: Prior HIV screening 11/2021, negative  UTD Flu Vaccine 12/2021  Declines Hep C screen  Last Mammogram 2020 in [facility]      03/07/2022    9:09 AM 03/02/2020    3:17 PM  Depression screen PHQ 2/9  Decreased Interest 0 0  Down, Depressed, Hopeless 0 0  PHQ - 2 Score 0 0  Altered sleeping 0   Tired, decreased energy 0   Change in appetite 0   Feeling bad or failure about yourself  0   Trouble concentrating 0   Moving slowly or fidgety/restless 0   Suicidal thoughts 0   PHQ-9 Score 0   Difficult doing work/chores Not difficult at all     Past Medical History:  Diagnosis Date   Diverticulosis    Fibroids    Past Surgical History:  Procedure Laterality Date   ABDOMINOPLASTY  2020   CESAREAN SECTION  1999   2003, 2007    Social History   Socioeconomic History   Marital status: Widowed    Spouse name: Not on file   Number of children: Not on file   Years of education: Not on file   Highest education level: Not on file  Occupational History   Not on file  Tobacco Use   Smoking status: Former    Packs/day: 1.00    Years: 10.00    Total pack years: 10.00    Types: Cigarettes   Smokeless tobacco: Former  Scientific laboratory technician Use: Never used  Substance and Sexual Activity   Alcohol use: Never   Drug use: Not Currently    Types: Marijuana   Sexual activity: Not on file  Other Topics Concern   Not on file  Social History Narrative   Not on file   Social Determinants of Health   Financial Resource Strain: Not on file  Food Insecurity: Not on file  Transportation Needs: Not on file  Physical Activity: Not on file  Stress: Not on file  Social Connections: Not on file  Intimate Partner Violence: Not on file   Family History  Problem Relation Age of Onset   Diabetes Sister    Hypertension Sister    Diabetes Maternal Grandmother    Stroke Maternal Grandmother    Hypertension Maternal Grandmother    Cervical cancer Neg Hx    Breast cancer Neg  Hx    Colon cancer Neg Hx    No current outpatient medications on file prior to visit.   No current facility-administered medications on file prior to visit.    Review of Systems  Constitutional:  Negative for activity change, appetite change, chills, diaphoresis, fatigue and fever.  HENT:  Negative for congestion and hearing loss.   Eyes:  Negative for visual disturbance.  Respiratory:  Negative for cough, chest tightness, shortness of breath and wheezing.   Cardiovascular:  Negative for chest pain, palpitations and leg swelling.  Gastrointestinal:  Positive for constipation. Negative for abdominal pain, diarrhea, nausea and vomiting.  Genitourinary:  Negative for dysuria, frequency and hematuria.  Musculoskeletal:  Negative for arthralgias  and neck pain.  Skin:  Negative for rash.       Hair loss  Neurological:  Negative for dizziness, weakness, light-headedness, numbness and headaches.  Hematological:  Negative for adenopathy.  Psychiatric/Behavioral:  Negative for behavioral problems, dysphoric mood and sleep disturbance.    Per HPI unless specifically indicated above      Objective:    BP 102/70 (BP Location: Right Arm, Patient Position: Sitting, Cuff Size: Normal)   Pulse 87   Resp 15   Ht '5\' 1"'$  (1.549 m)   Wt 181 lb 9.6 oz (82.4 kg)   SpO2 98%   BMI 34.31 kg/m   Wt Readings from Last 3 Encounters:  03/07/22 181 lb 9.6 oz (82.4 kg)  12/12/20 201 lb (91.2 kg)  03/15/20 201 lb 6.4 oz (91.4 kg)    Physical Exam Vitals and nursing note reviewed.  Constitutional:      General: She is not in acute distress.    Appearance: She is well-developed. She is not diaphoretic.     Comments: Well-appearing, comfortable, cooperative  HENT:     Head: Normocephalic and atraumatic.  Eyes:     General:        Right eye: No discharge.        Left eye: No discharge.     Conjunctiva/sclera: Conjunctivae normal.     Pupils: Pupils are equal, round, and reactive to light.  Neck:     Thyroid: No thyromegaly.  Cardiovascular:     Rate and Rhythm: Normal rate and regular rhythm.     Pulses: Normal pulses.     Heart sounds: Normal heart sounds. No murmur heard. Pulmonary:     Effort: Pulmonary effort is normal. No respiratory distress.     Breath sounds: Normal breath sounds. No wheezing or rales.  Abdominal:     General: Bowel sounds are normal. There is no distension.     Palpations: Abdomen is soft. There is no mass.     Tenderness: There is no abdominal tenderness.  Musculoskeletal:        General: No tenderness. Normal range of motion.     Cervical back: Normal range of motion and neck supple.     Comments: Upper / Lower Extremities: - Normal muscle tone, strength bilateral upper extremities 5/5, lower extremities  5/5  Lymphadenopathy:     Cervical: No cervical adenopathy.  Skin:    General: Skin is warm and dry.     Findings: No erythema or rash.     Comments: Hair loss non scarring central scalp forehead, mostly crown of head  Neurological:     Mental Status: She is alert and oriented to person, place, and time.     Comments: Distal sensation intact to light touch all extremities  Psychiatric:  Mood and Affect: Mood normal.        Behavior: Behavior normal.        Thought Content: Thought content normal.     Comments: Well groomed, good eye contact, normal speech and thoughts       Results for orders placed or performed during the hospital encounter of 05/09/21  Novel Coronavirus, NAA ([facility])   Specimen: Nasopharyngeal Swab; Nasopharyngeal(NP) swabs in vial transport medium   Nasopharynge  Result Value Ref Range   SARS-CoV-2, NAA Not Detected Not Detected      Assessment & Plan:   Problem List Items Addressed This Visit   None Visit Diagnoses     Annual physical exam    -  Primary   Relevant Orders   COMPLETE METABOLIC PANEL WITH GFR   CBC with Differential/Platelet   Lipid panel   Hemoglobin A1c   TSH   Obesity (BMI 30.0-34.9)       Relevant Orders   COMPLETE METABOLIC PANEL WITH GFR   CBC with Differential/Platelet   Lipid panel   Hemoglobin A1c   TSH   Family history of hyperlipidemia       Relevant Orders   Lipid panel   Family history of diabetes mellitus (DM)       Relevant Orders   Hemoglobin A1c   Screening for diabetes mellitus (DM)       Relevant Orders   Hemoglobin A1c   Lipid screening       Relevant Orders   Lipid panel   Encounter for screening mammogram for malignant neoplasm of breast       Relevant Orders   MM 3D SCREEN BREAST BILATERAL   Vitamin D deficiency       Relevant Orders   VITAMIN D 25 Hydroxy (Vit-D Deficiency, Fractures)   Constipation, unspecified constipation type       Female pattern hair loss       Relevant Orders    Ambulatory referral to Dermatology   Hair loss       Relevant Orders   Ambulatory referral to Dermatology       Updated Health Maintenance information Fasting lab orders today, pending results. Encouraged improvement to lifestyle with diet and exercise Goal of weight loss  Constipation IBS-C symptoms based on history Associated with abdominal pain and cramping Reviewed symptom relief medications Consider miralax daily low dose for maintenance  Vit D Deficiency Check lab Follow up treatment recommendations.  Mammogram ordered. [facility], she will request outside record prior mammo  Female Pattern Hair Loss significant amount of female patter hair loss on top of scalp primarily, non scarring, admits stressors and other factors for hair loss. Requesting further management   Orders Placed This Encounter  Procedures   MM 3D SCREEN BREAST BILATERAL    Standing Status:   Future    Standing Expiration Date:   03/08/2023    Order Specific Question:   Reason for Exam (SYMPTOM  OR DIAGNOSIS REQUIRED)    Answer:   Screening bilateral 3D Mammogram [place]    Order Specific Question:   Preferred imaging location?    Answer:   [facility]   COMPLETE METABOLIC PANEL WITH GFR   CBC with Differential/Platelet   Lipid panel    Order Specific Question:   Has the patient fasted?    Answer:   Yes   Hemoglobin A1c   TSH   VITAMIN D 25 Hydroxy (Vit-D Deficiency, Fractures)   Ambulatory referral to Dermatology  Referral Priority:   Routine    Referral Type:   Consultation    Referral Reason:   Specialty Services Required    Requested Specialty:   Dermatology    Number of Visits Requested:   1      No orders of the defined types were placed in this encounter.     Follow up plan: Return in about 1 year (around 03/08/2023) for 1 year Annual Physical AM apt fasting lab after.   Nobie Putnam, [facility] 03/07/2022,  9:19 AM

## 2022-03-08 LAB — COMPLETE METABOLIC PANEL WITH GFR
AG Ratio: 1.5 (calc) (ref 1.0–2.5)
ALT: 13 U/L (ref 6–29)
AST: 20 U/L (ref 10–30)
Albumin: 4 g/dL (ref 3.6–5.1)
Alkaline phosphatase (APISO): 35 U/L (ref 31–125)
BUN: 13 mg/dL (ref 7–25)
CO2: 27 mmol/L (ref 20–32)
Calcium: 9 mg/dL (ref 8.6–10.2)
Chloride: 104 mmol/L (ref 98–110)
Creat: 0.62 mg/dL (ref 0.50–0.99)
Globulin: 2.6 g/dL (calc) (ref 1.9–3.7)
Glucose, Bld: 81 mg/dL (ref 65–99)
Potassium: 4 mmol/L (ref 3.5–5.3)
Sodium: 140 mmol/L (ref 135–146)
Total Bilirubin: 0.6 mg/dL (ref 0.2–1.2)
Total Protein: 6.6 g/dL (ref 6.1–8.1)
eGFR: 113 mL/min/{1.73_m2} (ref >=60)

## 2022-03-08 LAB — LIPID PANEL
Cholesterol: 148 mg/dL (ref <200)
HDL: 51 mg/dL (ref >=50)
LDL Cholesterol (Calc): 84 mg/dL (calc)
Non-HDL Cholesterol (Calc): 97 mg/dL (calc) (ref <130)
Total CHOL/HDL Ratio: 2.9 (calc) (ref <5.0)
Triglycerides: 57 mg/dL (ref <150)

## 2022-03-08 LAB — CBC WITH DIFFERENTIAL/PLATELET
Absolute Monocytes: 267 cells/uL (ref 200–950)
Basophils Absolute: 22 cells/uL (ref 0–200)
Basophils Relative: 0.5 %
Eosinophils Absolute: 22 cells/uL (ref 15–500)
Eosinophils Relative: 0.5 %
HCT: 36.5 % (ref 35.0–45.0)
Hemoglobin: 12.1 g/dL (ref 11.7–15.5)
Lymphs Abs: 1432 cells/uL (ref 850–3900)
MCH: 30.6 pg (ref 27.0–33.0)
MCHC: 33.2 g/dL (ref 32.0–36.0)
MCV: 92.2 fL (ref 80.0–100.0)
MPV: 10.1 fL (ref 7.5–12.5)
Monocytes Relative: 6.2 %
Neutro Abs: 2559 cells/uL (ref 1500–7800)
Neutrophils Relative %: 59.5 %
Platelets: 303 10*3/uL (ref 140–400)
RBC: 3.96 10*6/uL (ref 3.80–5.10)
RDW: 12.5 % (ref 11.0–15.0)
Total Lymphocyte: 33.3 %
WBC: 4.3 10*3/uL (ref 3.8–10.8)

## 2022-03-08 LAB — HEMOGLOBIN A1C
Hgb A1c MFr Bld: 5.4 % of total Hgb (ref <5.7)
Mean Plasma Glucose: 108 mg/dL
eAG (mmol/L): 6 mmol/L

## 2022-03-08 LAB — TSH: TSH: 1.26 mIU/L

## 2022-03-08 LAB — VITAMIN D 25 HYDROXY (VIT D DEFICIENCY, FRACTURES): Vit D, 25-Hydroxy: 41 ng/mL (ref 30–100)

## 2022-03-27 ENCOUNTER — Ambulatory Visit
Admission: RE | Admit: 2022-03-27 | Discharge: 2022-03-27 | Disposition: A | Discharge status: Home / Self Care | Payer: Medicaid Other | Source: Ambulatory Visit | Attending: Family Medicine | Admitting: Family Medicine

## 2022-03-27 DIAGNOSIS — Z1231 Encounter for screening mammogram for malignant neoplasm of breast: Secondary | ICD-10-CM | POA: Diagnosis not present

## 2022-04-02 ENCOUNTER — Other Ambulatory Visit: Payer: Self-pay | Admitting: *Deleted

## 2022-04-02 ENCOUNTER — Inpatient Hospital Stay
Admission: RE | Admit: 2022-04-02 | Discharge: 2022-04-02 | Disposition: A | Discharge status: Home / Self Care | Payer: Self-pay | Source: Ambulatory Visit | Attending: *Deleted | Admitting: *Deleted

## 2022-04-02 DIAGNOSIS — Z1231 Encounter for screening mammogram for malignant neoplasm of breast: Secondary | ICD-10-CM

## 2022-04-04 ENCOUNTER — Encounter: Payer: Self-pay | Admitting: Family Medicine

## 2022-04-04 ENCOUNTER — Ambulatory Visit
Admission: RE | Admit: 2022-04-04 | Discharge: 2022-04-04 | Disposition: A | Discharge status: Home / Self Care | Payer: Medicaid Other | Source: Ambulatory Visit | Attending: Family Medicine | Admitting: Family Medicine

## 2022-04-04 ENCOUNTER — Ambulatory Visit (INDEPENDENT_AMBULATORY_CARE_PROVIDER_SITE_OTHER): Payer: Medicaid Other | Admitting: Family Medicine

## 2022-04-04 ENCOUNTER — Ambulatory Visit
Admission: RE | Admit: 2022-04-04 | Discharge: 2022-04-04 | Disposition: A | Discharge status: Home / Self Care | Payer: Medicaid Other | Attending: Family Medicine | Admitting: Family Medicine

## 2022-04-04 VITALS — BP 128/64 | HR 58 | Ht 61.0 in | Wt 182.6 lb

## 2022-04-04 DIAGNOSIS — K5900 Constipation, unspecified: Secondary | ICD-10-CM

## 2022-04-04 DIAGNOSIS — R198 Other specified symptoms and signs involving the digestive system and abdomen: Secondary | ICD-10-CM | POA: Diagnosis not present

## 2022-04-04 NOTE — Patient Instructions (Addendum)
Thank you for coming to the office today.  Possible constipation, stool can be in rectal vault with incomplete emptying.  Also can be internal hemorrhoid blocking  Or can be external hemorrhoid on rectal surface causing pain and swelling pressure.  X-ray today will evaluate the stool / constipation.  If there is large build up you may try even higher dose miralax clean out up to 4 caps per dose.  If it is an external hemorrhoid - recommend OTC creams like Preparation H and Witch Hazel wipes, to treat flares and symptoms.  If it is internal hemorrhoid we can refer to GI - through [facility]) [address], [place] [address] Phone: [phone]  Please schedule a Follow-up Appointment to: Return if symptoms worsen or fail to improve.  If you have any other questions or concerns, please feel free to call the office or send a message through [place]. You may also schedule an earlier appointment if necessary.  Additionally, you may be receiving a survey about your experience at our office within a few days to 1 week by e-mail or mail. We value your feedback.  Kristina Putnam, DO [place]

## 2022-04-04 NOTE — Progress Notes (Signed)
Subjective:    Patient ID: Kristina Parks, female    DOB: 04-14-1980, 42 y.o.   MRN: [id number]  Kristina Parks is a 42 y.o. female presenting on 04/04/2022 for rectal pressure   HPI  Rectal Pressure / Pain  Reports symptoms onset last 2-3 weeks, this week worsening. She reports some rectal / anal pressure and some mild to moderate pain 4 out of 10, and she took ibuprofen - She feels this pain pressure while sitting. Also this week she feels it worse with working out while on stationary bike or strength training low back exercises. - Does not feel like bowel movement pressure  She admits about 1 bowel movement per day. She did have some chronic constipation See previous discussion with constipation at last visit.  She says even after bowel movement she still feel pressure in rectal area.  She will usually have sporadic bowel movements, some days only small pieces and not large solid amount.  She used Miralax 1 cap daily for a few days. She also did 1 week of 3 caps per day, 2 in AM and 1 in PM with success of bowel movement but still had symptoms.  Tried OTC glycerin suppository enema and it was not successful. It caused pain and felt like something was blocking.  Possible hemorrhoids she said but never diagnosed.  Past history on constipation She has improved fiber intake, added daily mag citrate. Taking Probiotic. History of bloating and some lower abdominal cramping and indigestion Usually week before her menstrual cycle will go several days without BM, then will continually nursing the constipation, seems 1 week will have successful BMs then will have 2-3 weeks with difficulty.  She admits this is a longer term problem for her.  She will use OTC Castor Oil to help resolve the constipation.      03/07/2022    9:09 AM 03/02/2020    3:17 PM  Depression screen PHQ 2/9  Decreased Interest 0 0  Down, Depressed, Hopeless 0 0  PHQ - 2 Score 0 0  Altered sleeping 0    Tired, decreased energy 0   Change in appetite 0   Feeling bad or failure about yourself  0   Trouble concentrating 0   Moving slowly or fidgety/restless 0   Suicidal thoughts 0   PHQ-9 Score 0   Difficult doing work/chores Not difficult at all     Social History   Tobacco Use   Smoking status: Former    Packs/day: 1.00    Years: 10.00    Total pack years: 10.00    Types: Cigarettes   Smokeless tobacco: Former  Scientific laboratory technician Use: Never used  Substance Use Topics   Alcohol use: Never   Drug use: Not Currently    Types: Marijuana    Review of Systems Per HPI unless specifically indicated above     Objective:    BP 128/64   Pulse (!) 58   Ht '5\' 1"'$  (1.549 m)   Wt 182 lb 9.6 oz (82.8 kg)   LMP 03/27/2022   SpO2 99%   BMI 34.50 kg/m   Wt Readings from Last 3 Encounters:  04/04/22 182 lb 9.6 oz (82.8 kg)  03/07/22 181 lb 9.6 oz (82.4 kg)  12/12/20 201 lb (91.2 kg)    Physical Exam Vitals and nursing note reviewed.  Constitutional:      General: She is not in acute distress.    Appearance: Normal appearance. She is well-developed. She  is not diaphoretic.     Comments: Well-appearing, comfortable, cooperative  HENT:     Head: Normocephalic and atraumatic.  Eyes:     General:        Right eye: No discharge.        Left eye: No discharge.     Conjunctiva/sclera: Conjunctivae normal.  Cardiovascular:     Rate and Rhythm: Normal rate.  Pulmonary:     Effort: Pulmonary effort is normal.  Genitourinary:    Comments: External rectal exam performed today. Identified large skin tag / hemorrhoidal tissue externally at 6 o clock. No inflammation. Non tender. No fissure seen. No protruding internal hemorrhoid. No bleeding.  Chaperoned by Loni Muse CMA Skin:    General: Skin is warm and dry.     Findings: No erythema or rash.  Neurological:     Mental Status: She is alert and oriented to person, place, and time.  Psychiatric:        Mood and Affect:  Mood normal.        Behavior: Behavior normal.        Thought Content: Thought content normal.     Comments: Well groomed, good eye contact, normal speech and thoughts    I have personally reviewed the radiology report from 04/04/22 on [facility] STAT.  CLINICAL DATA:  Rectal pain and pressure with constipation for months.   EXAM: ABDOMEN - 1 VIEW   COMPARISON:  None Available.   FINDINGS: Two supine views of the abdomen are submitted. Mildly prominent stool throughout the colon. The rectum is not distended. No supine evidence of bowel wall thickening or pneumoperitoneum. Cholecystectomy clips are noted. There are bilateral pelvic calcifications which are likely phleboliths. Mild facet hypertrophy in the lower lumbar spine without evidence of acute osseous abnormality.   IMPRESSION: Mildly prominent stool throughout the colon consistent with constipation. No evidence of bowel obstruction.     Electronically Signed   By: Richardean Sale M.D.   On: 04/04/2022 10:23  Results for orders placed or performed in visit on 03/07/22  COMPLETE METABOLIC PANEL WITH GFR  Result Value Ref Range   Glucose, Bld 81 65 - 99 mg/dL   BUN 13 7 - 25 mg/dL   Creat 0.62 0.50 - 0.99 mg/dL   eGFR 113 > OR = 60 mL/min/1.67m   BUN/Creatinine Ratio SEE NOTE: 6 - 22 (calc)   Sodium 140 135 - 146 mmol/L   Potassium 4.0 3.5 - 5.3 mmol/L   Chloride 104 98 - 110 mmol/L   CO2 27 20 - 32 mmol/L   Calcium 9.0 8.6 - 10.2 mg/dL   Total Protein 6.6 6.1 - 8.1 g/dL   Albumin 4.0 3.6 - 5.1 g/dL   Globulin 2.6 1.9 - 3.7 g/dL (calc)   AG Ratio 1.5 1.0 - 2.5 (calc)   Total Bilirubin 0.6 0.2 - 1.2 mg/dL   Alkaline phosphatase (APISO) 35 31 - 125 U/L   AST 20 10 - 30 U/L   ALT 13 6 - 29 U/L  CBC with Differential/Platelet  Result Value Ref Range   WBC 4.3 3.8 - 10.8 Thousand/uL   RBC 3.96 3.80 - 5.10 Million/uL   Hemoglobin 12.1 11.7 - 15.5 g/dL   HCT 36.5 35.0 - 45.0 %   MCV 92.2 80.0 - 100.0 fL   MCH 30.6  27.0 - 33.0 pg   MCHC 33.2 32.0 - 36.0 g/dL   RDW 12.5 11.0 - 15.0 %   Platelets 303 140 - 400 Thousand/uL  MPV 10.1 7.5 - 12.5 fL   Neutro Abs 2,559 1,500 - 7,800 cells/uL   Lymphs Abs 1,432 850 - 3,900 cells/uL   Absolute Monocytes 267 200 - 950 cells/uL   Eosinophils Absolute 22 15 - 500 cells/uL   Basophils Absolute 22 0 - 200 cells/uL   Neutrophils Relative % 59.5 %   Total Lymphocyte 33.3 %   Monocytes Relative 6.2 %   Eosinophils Relative 0.5 %   Basophils Relative 0.5 %  Lipid panel  Result Value Ref Range   Cholesterol 148 <200 mg/dL   HDL 51 > OR = 50 mg/dL   Triglycerides 57 <150 mg/dL   LDL Cholesterol (Calc) 84 mg/dL (calc)   Total CHOL/HDL Ratio 2.9 <5.0 (calc)   Non-HDL Cholesterol (Calc) 97 <130 mg/dL (calc)  Hemoglobin A1c  Result Value Ref Range   Hgb A1c MFr Bld 5.4 <5.7 % of total Hgb   Mean Plasma Glucose 108 mg/dL   eAG (mmol/L) 6.0 mmol/L  TSH  Result Value Ref Range   TSH 1.26 mIU/L  VITAMIN D 25 Hydroxy (Vit-D Deficiency, Fractures)  Result Value Ref Range   Vit D, 25-Hydroxy 41 30 - 100 ng/mL      Assessment & Plan:   Problem List Items Addressed This Visit   None Visit Diagnoses     Rectal pressure    -  Primary   Relevant Orders   DG Abd 1 View (Completed)   Constipation, unspecified constipation type       Relevant Orders   DG Abd 1 View (Completed)       Still with chronic constipation, stool can be in rectal vault with incomplete emptying Despite her having some small partial incomplete bowel movements Improved regimen but still having symptoms now worsening rectal / anal pressure.  Considered hemorrhoids Exam today external suggests external hemorrhoid 6 o clock excess skin tag tissue not acutely inflamed. No internal exam done and indetermined if internal hemorrhoid.  X-ray today will evaluate the stool / constipation.  If there is large build up you may try even higher dose miralax clean out up to 4 caps per dose.  If it  is an external hemorrhoid - recommend OTC creams like Preparation H and Witch Hazel wipes, to treat flares and symptoms.  If it is internal hemorrhoid we can refer to GI - through [facility]  --------- Results of X-ray KUB show constipation throughout colon.  Called patient with results. Discussion on options. We agree to pursue aggressive miralax high dose clean out as advised and topical preparation H for hemorrhoid external and if not improving we will refer to GI for further management. She will let me know.  [facility]) [address] [place], [place] [address] Phone: [phone]   No orders of the defined types were placed in this encounter.     Follow up plan: Return if symptoms worsen or fail to improve.   Nobie Putnam, [facility] 04/04/2022, 9:33 AM

## 2022-07-18 ENCOUNTER — Ambulatory Visit: Payer: Medicaid Other | Admitting: Family Medicine

## 2022-07-18 ENCOUNTER — Other Ambulatory Visit (HOSPITAL_COMMUNITY)
Admission: RE | Admit: 2022-07-18 | Discharge: 2022-07-18 | Disposition: A | Discharge status: Home / Self Care | Payer: Medicaid Other | Source: Ambulatory Visit | Attending: Family Medicine | Admitting: Family Medicine

## 2022-07-18 VITALS — BP 110/74 | HR 64 | Ht 61.0 in | Wt 182.0 lb

## 2022-07-18 DIAGNOSIS — B9689 Other specified bacterial agents as the cause of diseases classified elsewhere: Secondary | ICD-10-CM | POA: Diagnosis not present

## 2022-07-18 DIAGNOSIS — R300 Dysuria: Secondary | ICD-10-CM

## 2022-07-18 DIAGNOSIS — N760 Acute vaginitis: Secondary | ICD-10-CM

## 2022-07-18 DIAGNOSIS — B3731 Acute candidiasis of vulva and vagina: Secondary | ICD-10-CM

## 2022-07-18 MED ORDER — METRONIDAZOLE 500 MG PO TABS: 500.0000 mg | ORAL_TABLET | Freq: Two times a day (BID) | ORAL | 0 refills | Status: DC

## 2022-07-18 MED ORDER — FLUCONAZOLE 150 MG PO TABS: ORAL_TABLET | ORAL | 1 refills | Status: DC

## 2022-07-18 NOTE — Patient Instructions (Addendum)
Thank you for coming to the office today.  We will test for urine infection and for BV and Yeast   Your symptoms sound consistent with a Vaginitis - irritation from likely BV or yeast.  Tested wet prep today looking for BV, Yeast, and Trichomonas - will call you with results.  1. For Bacterial Vaginosis (BV) take Metronidazole 500mg  twice daily for 7 days, do not drink alcohol on this med as it will cause nausea/vomiting. - This problem can be recurrent. Some people are more likely to get it than others, and it has to do with normal body chemistry and vaginal environment. One of the biggest risk factors for causing BV is unprotected sexual intercourse (without a condom), because semen can change the chemistry of your vaginal environment, causing the "good bacteria" reduce and the other bacteria to grow and cause your symptoms. Recommend to start using condoms with intercourse to see if this helps reduce your symptoms, do this especially for next 1 week while on treatment. Some other recommendations include taking a daily probiotic and yogurt can help reduce BV as well, this is not proven to work in all patients.  I have also ordered a Yeast Medication Take this AFTER Metronidazole.   If tests negative, maybe normal symptoms following period. Unprotected intercourse can cause irritation and no infection sometimes. Make sure to try using condoms to see if reduces your symptoms. Probiotics and yogurt can help reduce BV as well.  Also to treat bacterial build up can try OTC - Boric Acid Suppositories 600MG   #21 insert one vaginal at hs x 3 wks   We also checked a Urine test to look for urinary infection. If this is positive we will contact you by mychart and change antibiotics.  Please schedule a Follow-up Appointment to: Return if symptoms worsen or fail to improve.  If you have any other questions or concerns, please feel free to call the office or send a message through MyChart. You may also  schedule an earlier appointment if necessary.  Additionally, you may be receiving a survey about your experience at our office within a few days to 1 week by e-mail or mail. We value your feedback.  Saralyn Pilar, DO [place], [place]

## 2022-07-18 NOTE — Progress Notes (Signed)
Subjective:    Patient ID: Kristina Parks, female    DOB: 11/28/1980, 42 y.o.   MRN: [id number]  Brette Piccini is a 42 y.o. female presenting on 07/18/2022 for Vaginal Itching   HPI  Vaginitis Reports symptoms of vaginal irritation itching. Reports last week attempt at anal intercourse with boyfriend and then switched to vaginal discharge Admits developed vaginal irritation and itching and discharge. Now discharge has improved after tried OTC yeast medication trial. She still has vaginal irritation      03/07/2022    9:09 AM 03/02/2020    3:17 PM  Depression screen PHQ 2/9  Decreased Interest 0 0  Down, Depressed, Hopeless 0 0  PHQ - 2 Score 0 0  Altered sleeping 0   Tired, decreased energy 0   Change in appetite 0   Feeling bad or failure about yourself  0   Trouble concentrating 0   Moving slowly or fidgety/restless 0   Suicidal thoughts 0   PHQ-9 Score 0   Difficult doing work/chores Not difficult at all     Social History   Tobacco Use   Smoking status: Former    Packs/day: 1.00    Years: 10.00    Additional pack years: 0.00    Total pack years: 10.00    Types: Cigarettes   Smokeless tobacco: Former  Building services engineer Use: Never used  Substance Use Topics   Alcohol use: Never   Drug use: Not Currently    Types: Marijuana    Review of Systems Per HPI unless specifically indicated above     Objective:    BP 110/74   Pulse 64   Ht 5\' 1"  (1.549 m)   Wt 182 lb (82.6 kg)   LMP 07/09/2022   SpO2 100%   BMI 34.39 kg/m   Wt Readings from Last 3 Encounters:  07/18/22 182 lb (82.6 kg)  04/04/22 182 lb 9.6 oz (82.8 kg)  03/07/22 181 lb 9.6 oz (82.4 kg)    Physical Exam Vitals and nursing note reviewed.  Constitutional:      General: She is not in acute distress.    Appearance: Normal appearance. She is well-developed. She is not diaphoretic.     Comments: Well-appearing, comfortable, cooperative  HENT:     Head: Normocephalic and  atraumatic.  Eyes:     General:        Right eye: No discharge.        Left eye: No discharge.     Conjunctiva/sclera: Conjunctivae normal.  Cardiovascular:     Rate and Rhythm: Normal rate.  Pulmonary:     Effort: Pulmonary effort is normal.  Skin:    General: Skin is warm and dry.     Findings: No erythema or rash.  Neurological:     Mental Status: She is alert and oriented to person, place, and time.  Psychiatric:        Mood and Affect: Mood normal.        Behavior: Behavior normal.        Thought Content: Thought content normal.     Comments: Well groomed, good eye contact, normal speech and thoughts      Results for orders placed or performed in visit on 03/07/22  COMPLETE METABOLIC PANEL WITH GFR  Result Value Ref Range   Glucose, Bld 81 65 - 99 mg/dL   BUN 13 7 - 25 mg/dL   Creat 0.86 5.78 - 4.69 mg/dL   eGFR 629 >  OR = 60 mL/min/1.75m2   BUN/Creatinine Ratio SEE NOTE: 6 - 22 (calc)   Sodium 140 135 - 146 mmol/L   Potassium 4.0 3.5 - 5.3 mmol/L   Chloride 104 98 - 110 mmol/L   CO2 27 20 - 32 mmol/L   Calcium 9.0 8.6 - 10.2 mg/dL   Total Protein 6.6 6.1 - 8.1 g/dL   Albumin 4.0 3.6 - 5.1 g/dL   Globulin 2.6 1.9 - 3.7 g/dL (calc)   AG Ratio 1.5 1.0 - 2.5 (calc)   Total Bilirubin 0.6 0.2 - 1.2 mg/dL   Alkaline phosphatase (APISO) 35 31 - 125 U/L   AST 20 10 - 30 U/L   ALT 13 6 - 29 U/L  CBC with Differential/Platelet  Result Value Ref Range   WBC 4.3 3.8 - 10.8 Thousand/uL   RBC 3.96 3.80 - 5.10 Million/uL   Hemoglobin 12.1 11.7 - 15.5 g/dL   HCT 16.1 09.6 - 04.5 %   MCV 92.2 80.0 - 100.0 fL   MCH 30.6 27.0 - 33.0 pg   MCHC 33.2 32.0 - 36.0 g/dL   RDW 40.9 81.1 - 91.4 %   Platelets 303 140 - 400 Thousand/uL   MPV 10.1 7.5 - 12.5 fL   Neutro Abs 2,559 1,500 - 7,800 cells/uL   Lymphs Abs 1,432 850 - 3,900 cells/uL   Absolute Monocytes 267 200 - 950 cells/uL   Eosinophils Absolute 22 15 - 500 cells/uL   Basophils Absolute 22 0 - 200 cells/uL    Neutrophils Relative % 59.5 %   Total Lymphocyte 33.3 %   Monocytes Relative 6.2 %   Eosinophils Relative 0.5 %   Basophils Relative 0.5 %  Lipid panel  Result Value Ref Range   Cholesterol 148 <200 mg/dL   HDL 51 > OR = 50 mg/dL   Triglycerides 57 <782 mg/dL   LDL Cholesterol (Calc) 84 mg/dL (calc)   Total CHOL/HDL Ratio 2.9 <5.0 (calc)   Non-HDL Cholesterol (Calc) 97 <956 mg/dL (calc)  Hemoglobin O1H  Result Value Ref Range   Hgb A1c MFr Bld 5.4 <5.7 % of total Hgb   Mean Plasma Glucose 108 mg/dL   eAG (mmol/L) 6.0 mmol/L  TSH  Result Value Ref Range   TSH 1.26 mIU/L  VITAMIN D 25 Hydroxy (Vit-D Deficiency, Fractures)  Result Value Ref Range   Vit D, 25-Hydroxy 41 30 - 100 ng/mL      Assessment & Plan:   Problem List Items Addressed This Visit   None Visit Diagnoses     Acute vaginitis    -  Primary   Relevant Orders   Cervicovaginal ancillary only   Yeast vaginitis       Relevant Medications   metroNIDAZOLE (FLAGYL) 500 MG tablet   fluconazole (DIFLUCAN) 150 MG tablet   Other Relevant Orders   Cervicovaginal ancillary only   BV (bacterial vaginosis)       Relevant Medications   metroNIDAZOLE (FLAGYL) 500 MG tablet   fluconazole (DIFLUCAN) 150 MG tablet   Other Relevant Orders   Cervicovaginal ancillary only   Dysuria       Relevant Orders   Urinalysis, Routine w reflex microscopic   Urine Culture       Orders Placed This Encounter  Procedures   Urine Culture   Urinalysis, Routine w reflex microscopic   History suggestive of vaginitis with bv vs yeast OTC yeast therapy temporary relief, but did not resolve Not as suggestive of UTI but she does have  dysuria and frequency.  Plan: 1. Rx Start Metronidazole 500mg  BID x 7 days (avoid alcohol) 2. Rx Diflucan course after 3. Self collect swab today, pending results 4. Urinalysis + Urine culture send out  Advice per AVS  Meds ordered this encounter  Medications   metroNIDAZOLE (FLAGYL) 500 MG tablet     Sig: Take 1 tablet (500 mg total) by mouth 2 (two) times daily. Do not drink alcohol while taking this medicine.    Dispense:  14 tablet    Refill:  0   fluconazole (DIFLUCAN) 150 MG tablet    Sig: Take one tablet by mouth on Day 1. Repeat dose 2nd tablet on Day 3.    Dispense:  2 tablet    Refill:  1      Follow up plan: Return if symptoms worsen or fail to improve.   Saralyn Pilar, DO [facility] 07/18/2022, 3:19 PM

## 2022-07-19 LAB — URINALYSIS, ROUTINE W REFLEX MICROSCOPIC
Bilirubin Urine: NEGATIVE
Glucose, UA: NEGATIVE
Hgb urine dipstick: NEGATIVE
Ketones, ur: NEGATIVE
Leukocytes,Ua: NEGATIVE
Nitrite: NEGATIVE
Protein, ur: NEGATIVE
Specific Gravity, Urine: 1.005 (ref 1.001–1.035)
pH: 7 (ref 5.0–8.0)

## 2022-07-19 LAB — URINE CULTURE
MICRO NUMBER:: 14931156
SPECIMEN QUALITY:: ADEQUATE

## 2022-07-20 LAB — CERVICOVAGINAL ANCILLARY ONLY
Bacterial Vaginitis (gardnerella): NEGATIVE
Candida Glabrata: NEGATIVE
Candida Vaginitis: NEGATIVE
Chlamydia: NEGATIVE
Comment: NEGATIVE
Comment: NEGATIVE
Comment: NEGATIVE
Comment: NEGATIVE
Comment: NEGATIVE
Comment: NORMAL
Neisseria Gonorrhea: NEGATIVE
Trichomonas: NEGATIVE

## 2022-09-27 IMAGING — MR MR PELVIS WO/W CM
13 of 15 series · 37 of 48 positions shown · IV contrast (9ml Gadavist)
Comparison: Ultrasound on 12/06/2020

CLINICAL DATA: Pelvic pain. Possible uterine mass on recent
ultrasound.

EXAM:
MRI PELVIS WITHOUT AND WITH CONTRAST
TECHNIQUE: Multiplanar multisequence MR imaging of the pelvis was performed
both before and after administration of intravenous contrast.
CONTRAST:  9mL GADAVIST GADOBUTROL 1 MMOL/ML IV SOLN

[Series 2: T2 · coronal · 5.0mm · 1.41mm/px · 1 of 30 slices shown (1 of 3)]
[im 1/30]
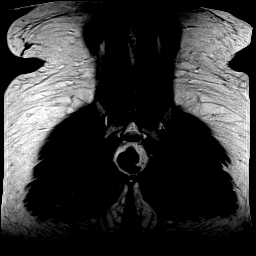

[Series 3: T2 · coronal · 5.0mm · 0.69mm/px · 1 of 31 slices shown (2 of 3)]
[im 1/31]
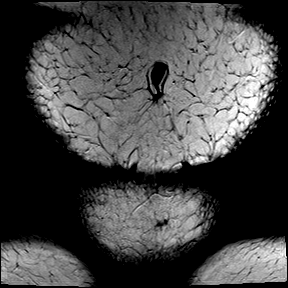

[Series 4: T2 · axial · 5.0mm · 0.75mm/px · z∈[-147,+69]mm · 2 of 37 slices shown (3 of 3)]
[im 1/37]
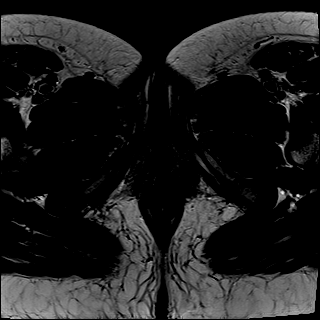
[im 37/37]
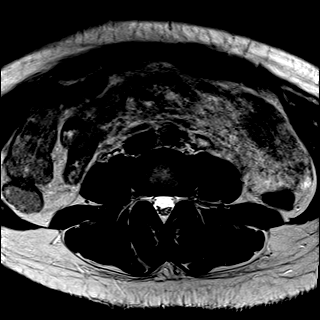

[Series 5: T2 fat-sat · axial · 5.0mm · 0.90mm/px · z∈[-147,+69]mm · 2 of 37 slices shown]
[im 1/37]
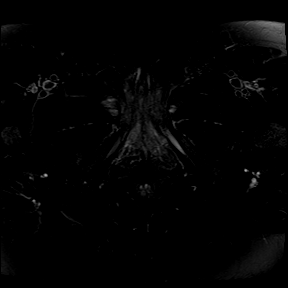
[im 37/37]
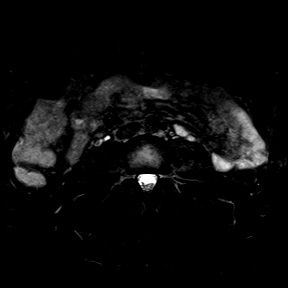

[Series 6: sag tse · sagittal · 5.0mm · 0.75mm/px · 2 of 33 slices shown]
[im 1/33]
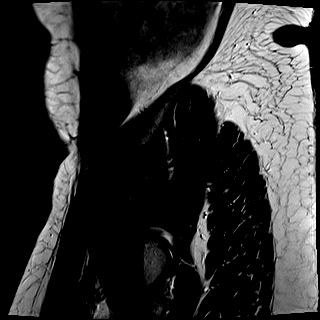
[im 33/33]
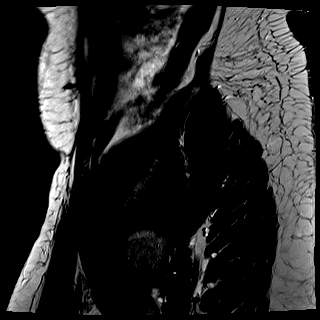

[Series 7: ax dwi_tracew · axial · 6.0mm · 1.14mm/px · z∈[-157,+80]mm · 5 of 102 slices shown]
[im 1/102]
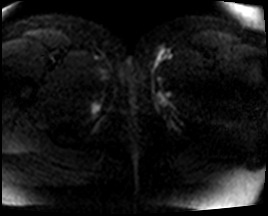
[im 26/102]
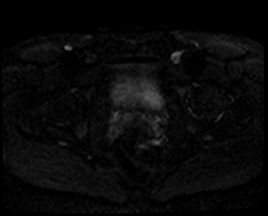
[im 51/102]
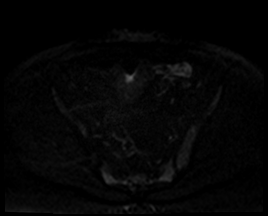
[im 76/102]
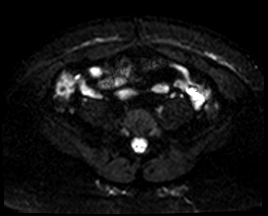
[im 102/102]
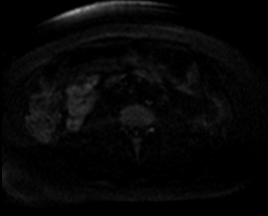

[Series 8: ax dwi_adc · axial · 6.0mm · 1.14mm/px · z∈[-157,+80]mm · 2 of 34 slices shown]
[im 1/34]
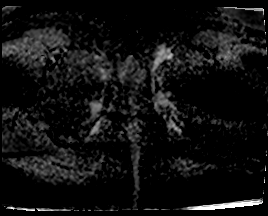
[im 34/34]
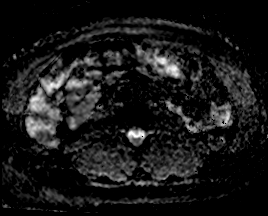

[Series 9: DIXON · axial · 3.0mm · 1.19mm/px · z∈[-165,+48]mm · 4 of 72 slices shown (1 of 2)]
[im 1/72]
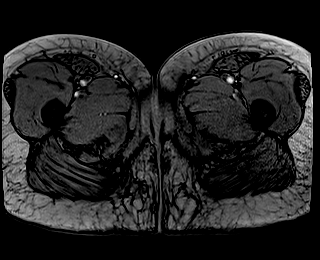
[im 24/72]
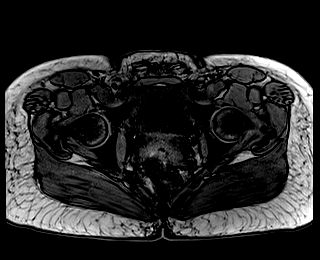
[im 48/72]
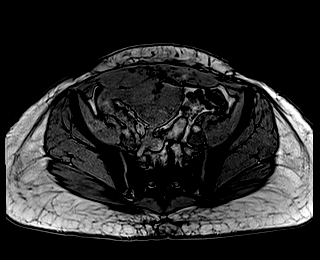
[im 72/72]
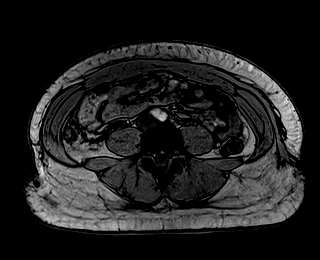

[Series 10: DIXON · axial · 3.0mm · 1.19mm/px · z∈[-165,+48]mm · 4 of 72 slices shown (2 of 2)]
[im 1/72]
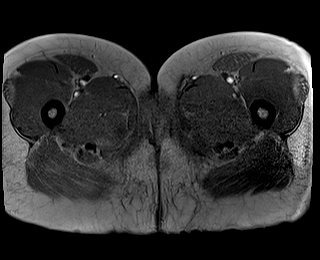
[im 24/72]
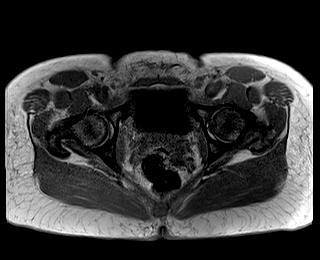
[im 48/72]
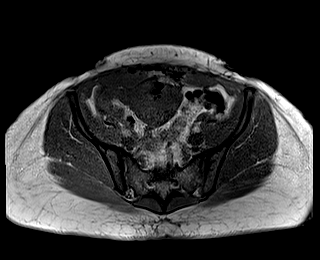
[im 72/72]
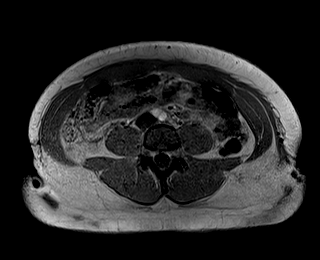

[Series 12: T1 dynamic fat-sat · axial · 3.0mm · 0.47mm/px · z∈[-157,+80]mm · 4 of 80 slices shown]
[im 1/80]
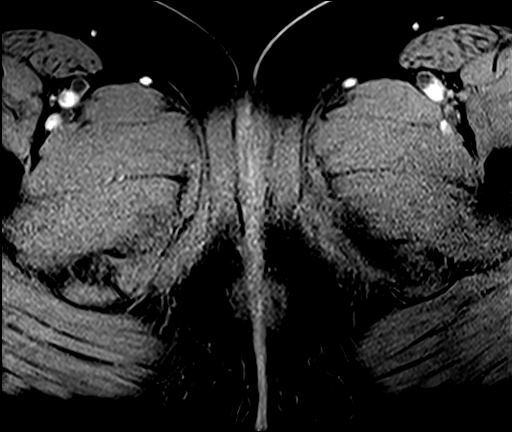
[im 27/80]
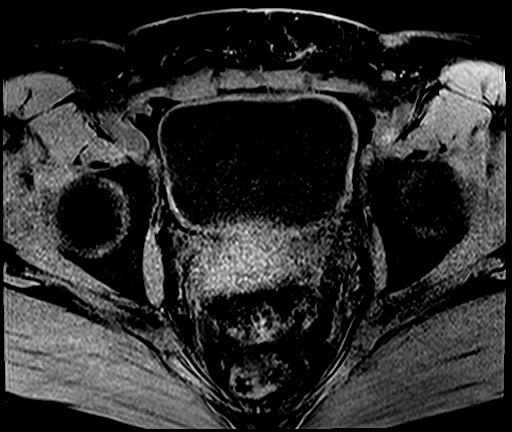
[im 53/80]
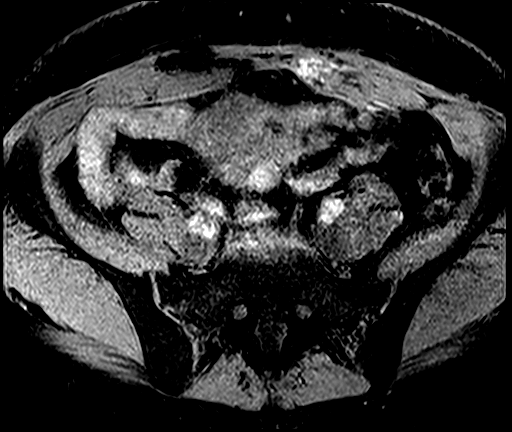
[im 80/80]
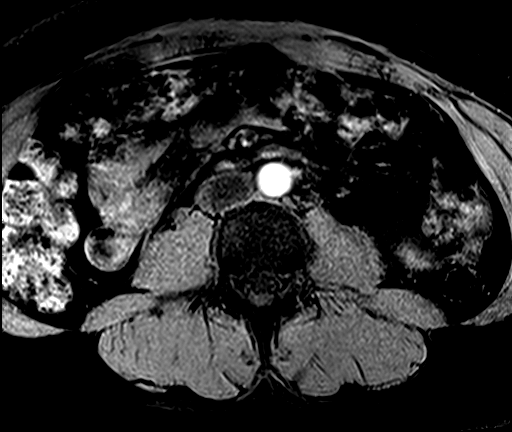

[Series 13: T1 dynamic · axial · 3.0mm · 0.47mm/px · z∈[-157,+80]mm · 4 of 80 slices shown (1 of 2)]
[im 1/80]
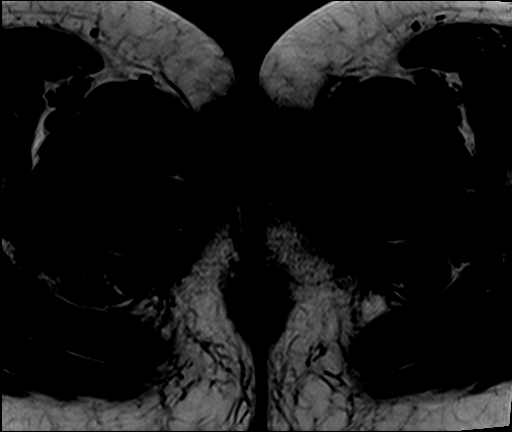
[im 27/80]
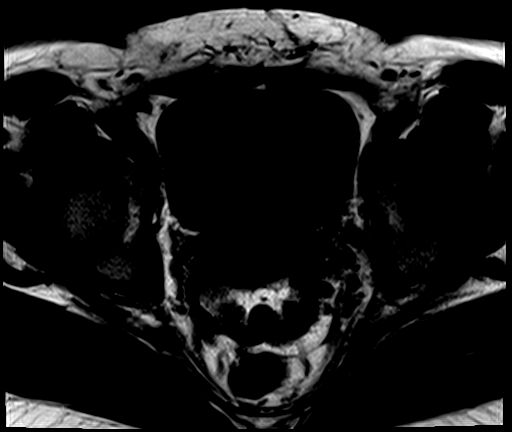
[im 53/80]
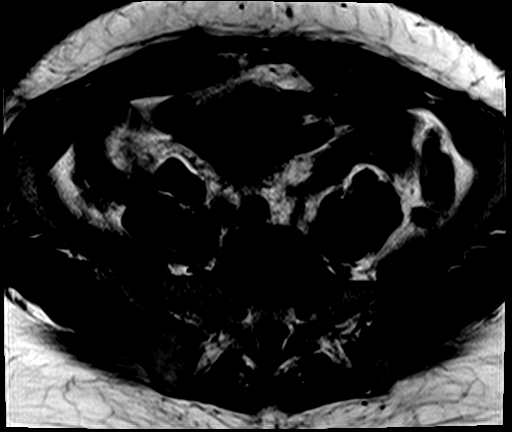
[im 80/80]
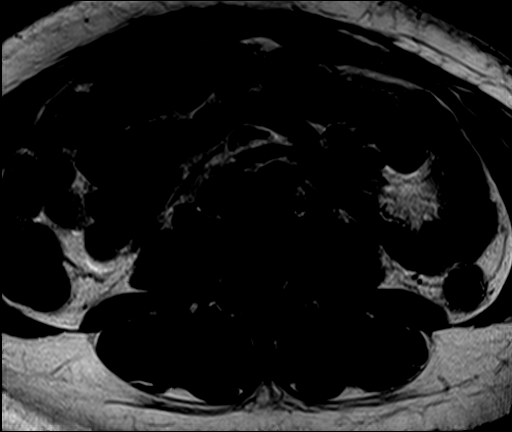

[Series 14: T1 dynamic · axial · 3.0mm · 0.47mm/px · z∈[-157,+80]mm · 4 of 80 slices shown (2 of 2)]
[im 1/80]
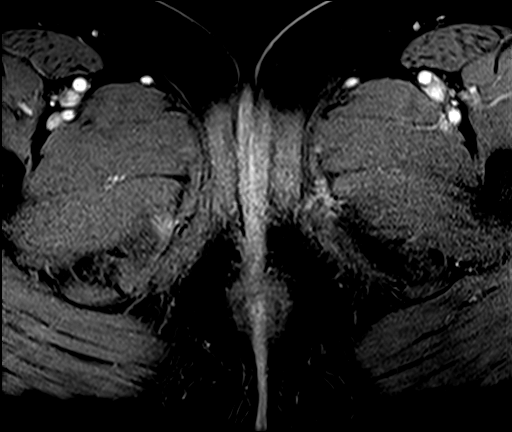
[im 27/80]
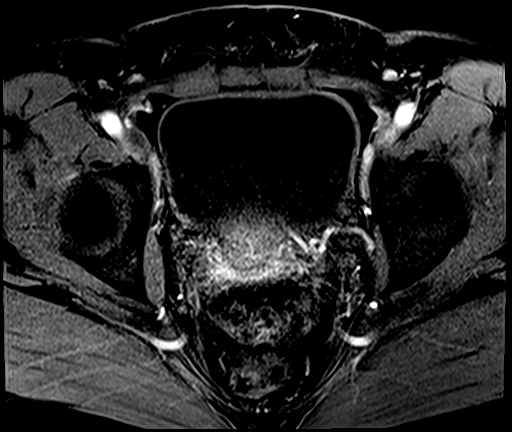
[im 53/80]
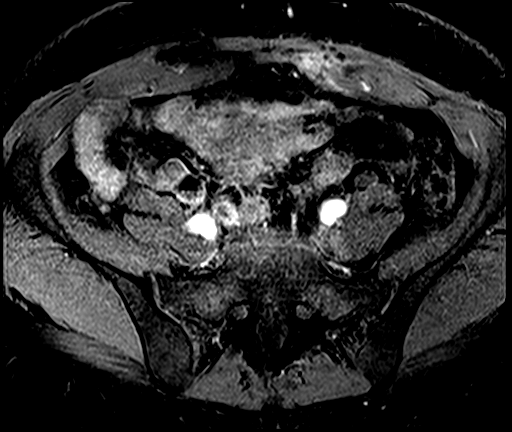
[im 80/80]
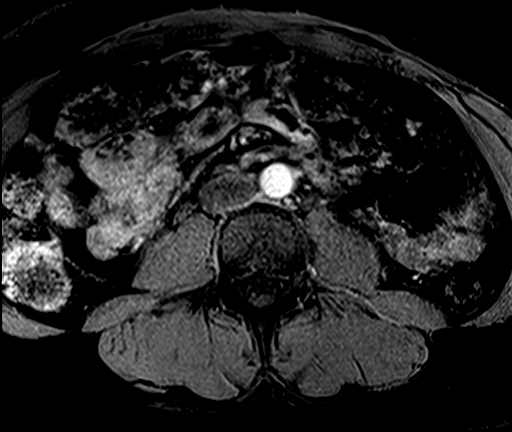

[Series 15: T1 fat-sat post-contrast · axial · 4.0mm · 1.19mm/px · z∈[-166,-41]mm · 2 of 52 slices shown]
[im 1/52]
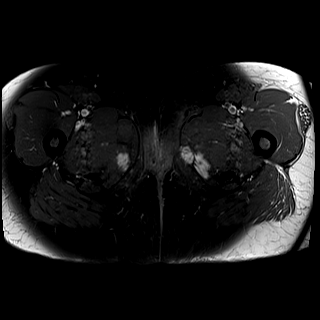
[im 26/52]
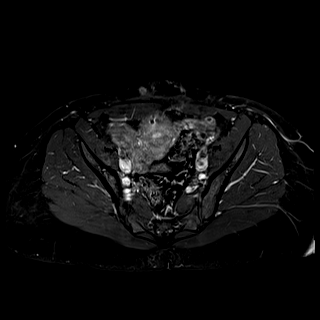

[37 of 48 positions shown; findings below may reference images not displayed]

FINDINGS: Lower Urinary Tract: No urinary bladder or urethral abnormality
identified.

Bowel: Sigmoid diverticulosis is seen, however there is no evidence
of diverticulitis.

Vascular/Lymphatic: Unremarkable. No pathologically enlarged pelvic
lymph nodes identified.

Reproductive:

-- Uterus: Measures 11.4 x 5.9 x 5.9 cm (volume = 210 cm^3).
Anteverted but retroflexed. C-section scar seen in the anterior
lower uterine segment, which accounts for the contour bulge noted on
recent ultrasound. Probable adhesion of the anterior uterine corpus
to the anterior abdominal wall soft tissues noted. No uterine
fibroids are seen. Endometrial thickness measures approximately 3
mm. Cervix and vagina are normal appearance. a

-- Right ovary: Appears normal. No ovarian or adnexal masses
identified.

-- Left ovary: Appears normal. No ovarian or adnexal masses
identified.

Other: No peritoneal thickening or abnormal free fluid.

Musculoskeletal:  Unremarkable.
IMPRESSION: C-section scar in the anterior lower uterine segment, which accounts
for the contour bulge noted on recent ultrasound. Probable adhesion
between the anterior uterine corpus and anterior abdominal wall soft
tissues.

No evidence of uterine fibroids.

Normal appearance of both ovaries. No adnexal mass.

Sigmoid diverticulosis. No radiographic evidence of diverticulitis.

## 2022-11-27 ENCOUNTER — Ambulatory Visit
Admission: RE | Admit: 2022-11-27 | Discharge: 2022-11-27 | Disposition: A | Discharge status: Home / Self Care | Payer: Medicaid Other | Source: Ambulatory Visit | Attending: Emergency Medicine | Admitting: Emergency Medicine

## 2022-11-27 ENCOUNTER — Ambulatory Visit (INDEPENDENT_AMBULATORY_CARE_PROVIDER_SITE_OTHER): Payer: Medicaid Other

## 2022-11-27 VITALS — BP 127/85 | HR 62 | Temp 98.6°F | Resp 18 | Wt 177.0 lb

## 2022-11-27 DIAGNOSIS — M25572 Pain in left ankle and joints of left foot: Secondary | ICD-10-CM | POA: Diagnosis not present

## 2022-11-27 DIAGNOSIS — M722 Plantar fascial fibromatosis: Secondary | ICD-10-CM | POA: Diagnosis not present

## 2022-11-27 MED ORDER — IBUPROFEN 600 MG PO TABS: 600.0000 mg | ORAL_TABLET | Freq: Four times a day (QID) | ORAL | 0 refills | Status: AC | PRN

## 2022-11-27 NOTE — Discharge Instructions (Addendum)
Your xray is negative for acute findings.  May take ibuprofen as prescribed or tylenol over the counter as label directed for pain. Follow up with [facility] if pain persists.  [facility]: [address] [place], [place] [address] Phone: [phone] Urgent care hours 8a-7:30p Mon-Sat

## 2022-11-27 NOTE — ED Provider Notes (Signed)
[facility]    CSN: [id number] Arrival date & time: 11/27/22  1709      History   Chief Complaint Chief Complaint  Patient presents with   Ankle Pain    Larey Seat off the bed 2weeks ago, left ankle is swelling and painful. Now left foot pain. - Entered by patient    HPI Kristina Parks is a 42 y.o. female.   42 year old female pt, Kristina Parks, presents to urgetn care for evaluation of left ankle pain after falling off bed 2 weeks prior. States left ankle hurts to flex. Pt is wearing healed boots in office today.  The history is provided by the patient. No language interpreter was used.    Past Medical History:  Diagnosis Date   Diverticulosis    Fibroids     There are no problems to display for this patient.   Past Surgical History:  Procedure Laterality Date   ABDOMINOPLASTY  2020   CESAREAN SECTION  1999   2003, 2007    OB History     Gravida  3   Para  3   Term  3   Preterm      AB      Living  3      SAB      IAB      Ectopic      Multiple      Live Births               Home Medications    Prior to Admission medications   Medication Sig Start Date End Date Taking? Authorizing Provider  Biotin 2.5 MG TABS Take by mouth.    [provider]  cholecalciferol (VITAMIN D3) 25 MCG (1000 UNIT) tablet Take 1,000 Units by mouth daily.    [provider]  Cyanocobalamin (VITAMIN B 12 PO) Take by mouth.    [provider]  fluconazole (DIFLUCAN) 150 MG tablet Take one tablet by mouth on Day 1. Repeat dose 2nd tablet on Day 3. 07/18/22   Althea Charon, Netta Neat, DO  MAGNESIUM CITRATE PO Take 250 mg by mouth.    [provider]  metroNIDAZOLE (FLAGYL) 500 MG tablet Take 1 tablet (500 mg total) by mouth 2 (two) times daily. Do not drink alcohol while taking this medicine. 07/18/22   Karamalegos, Netta Neat, DO  Probiotic Product (PROBIOTIC DAILY PO) Take by mouth.    [provider]     Family History Family History  Problem Relation Age of Onset   Diabetes Sister    Hypertension Sister    Diabetes Maternal Grandmother    Stroke Maternal Grandmother    Hypertension Maternal Grandmother    Cervical cancer Neg Hx    Breast cancer Neg Hx    Colon cancer Neg Hx     Social History Social History   Tobacco Use   Smoking status: Former    Current packs/day: 1.00    Average packs/day: 1 pack/day for 10.0 years (10.0 ttl pk-yrs)    Types: Cigarettes   Smokeless tobacco: Former  Building services engineer status: Never Used  Substance Use Topics   Alcohol use: Never   Drug use: Not Currently    Types: Marijuana     Allergies   Lamisil [terbinafine]   Review of Systems Review of Systems  Musculoskeletal:  Positive for arthralgias, gait problem, joint swelling and myalgias.  Skin: Negative.   All other systems reviewed and are negative.  Physical Exam Triage Vital Signs ED Triage Vitals  Encounter Vitals Group     BP      Systolic BP Percentile      Diastolic BP Percentile      Pulse      Resp      Temp      Temp src      SpO2      Weight      Height      Head Circumference      Peak Flow      Pain Score      Pain Loc      Pain Education      Exclude from Growth Chart    No data found.  Updated Vital Signs There were no vitals taken for this visit.  Visual Acuity Right Eye Distance:   Left Eye Distance:   Bilateral Distance:    Right Eye Near:   Left Eye Near:    Bilateral Near:     Physical Exam Vitals and nursing note reviewed.  Constitutional:      Appearance: She is well-developed and well-groomed.  Cardiovascular:     Rate and Rhythm: Normal rate and regular rhythm.     Pulses: Normal pulses.          Dorsalis pedis pulses are 2+ on the left side.     Heart sounds: Normal heart sounds.  Musculoskeletal:       Feet:  Feet:     Comments: +TTP of marked area Skin:    General: Skin is warm.     Capillary Refill:  Capillary refill takes less than 2 seconds.     Findings: No ecchymosis or erythema.  Neurological:     General: No focal deficit present.     Mental Status: She is alert and oriented to person, place, and time.     GCS: GCS eye subscore is 4. GCS verbal subscore is 5. GCS motor subscore is 6.     Cranial Nerves: No cranial nerve deficit.     Sensory: No sensory deficit.  Psychiatric:        Behavior: Behavior is cooperative.      UC Treatments / Results  Labs (all labs ordered are listed, but only abnormal results are displayed) Labs Reviewed - No data to display  EKG   Radiology No results found.  Procedures Procedures (including critical care time)  Medications Ordered in UC Medications - No data to display  Initial Impression / Assessment and Plan / UC Course  I have reviewed the triage vital signs and the nursing notes.  Pertinent labs & imaging results that were available during my care of the patient were reviewed by me and considered in my medical decision making (see chart for details).  Clinical Course as of 11/27/22 1844  Tue Nov 27, 2022  1821 Left ankle xray pending [JD]    Clinical Course User Index [JD] Townes Fuhs, Para March, NP   Discussed exam findings and plan of care with patient, strict go to ER precautions given.   Patient verbalized understanding to this provider.  Ddx: Left ankle pain,sprain, plantar fasciitis  Final Clinical Impressions(s) / UC Diagnoses   Final diagnoses:  None   Discharge Instructions   None    ED Prescriptions   None    PDMP not reviewed this encounter.   Clancy Gourd, NP 11/27/22 1904

## 2022-11-27 NOTE — ED Triage Notes (Signed)
Left ankle pain. Patient fell off bed 2 weeks ago.

## 2022-12-12 ENCOUNTER — Telehealth: Payer: Self-pay

## 2022-12-12 ENCOUNTER — Encounter: Payer: Self-pay | Admitting: Family Medicine

## 2022-12-12 DIAGNOSIS — K5909 Other constipation: Secondary | ICD-10-CM

## 2022-12-12 DIAGNOSIS — R198 Other specified symptoms and signs involving the digestive system and abdomen: Secondary | ICD-10-CM

## 2022-12-12 NOTE — Telephone Encounter (Signed)
Copied from CRM [id number]. Topic: Referral - Request for Referral >> Dec 12, 2022 12:56 PM Clide Dales wrote: Has patient seen PCP for this complaint? Yes.   *If NO, is insurance requiring patient see PCP for this issue before PCP can refer them? Referral for which specialty: Laurette Schimke Preferred provider/office: Any Reason for referral:GI issues

## 2022-12-12 NOTE — Telephone Encounter (Signed)
Referral submitted to [facility] I will respond to her on MyChart  Saralyn Pilar, DO [facility] 12/12/2022, 4:45 PM

## 2022-12-13 ENCOUNTER — Ambulatory Visit: Payer: Medicaid Other | Admitting: Family Medicine

## 2023-01-14 ENCOUNTER — Other Ambulatory Visit: Payer: Self-pay

## 2023-01-15 NOTE — Progress Notes (Unsigned)
Celso Amy, PA-C [address]  [address]  [place], [place] [address]  Main: [phone]  Fax: [phone]   Gastroenterology Consultation  Referring Provider:     Saralyn Pilar * Primary Care Physician:  Smitty Cords, DO Primary Gastroenterologist:  Celso Amy, PA-C  Reason for Consultation:     Constipation, Abdominal Pain        HPI:   Kristina Parks is a 43 y.o. y/o female referred for consultation & management  by Smitty Cords, DO.    Patient was seen by her PCP 03/2022 to evaluate rectal pressure, rectal pain, and constipation.  History of constipation for many years.  Tried MiraLAX, and glycerin suppository.  Tried fiber, daily mag citrate, and probiotic.  Also takes OTC castor oil as needed.  Treated with Preparation H and witch hazel wipes for hemorrhoids.  Abdominal x-ray 03/2022 showed mild prominent stool throughout the colon consistent with constipation.  Labs 02/2022: Normal TSH and CBC, hemoglobin 12.1.  Current symptoms: He continues to have worsening constipation for over 1 year.  She is currently taking magnesium and probiotics daily for the past year.  She tried MiraLAX which caused headaches and she discontinued.  She drinks 2 L of water per day and exercises regularly.  Previous cholecystectomy 12 years ago.  3 C-sections and abdominoplasty surgery.  She reports having worsening left lower quadrant abdominal pain which is worse when she feels constipated.  Better after bowel movement.  She feels a knot or bulge in the left lower quadrant.  She also has episodes of epigastric discomfort and indigestion when she is constipated.  Feels a lot of rectal pressure.  She denies rectal bleeding.  Denies family history of colon cancer.  She has had a 25 pound weight loss in the past 2 years.  No previous GI evaluation, colonoscopy, EGD, or CT abdomen / pelvis.  Past Medical History:  Diagnosis Date   Diverticulosis    Fibroids      Past Surgical History:  Procedure Laterality Date   ABDOMINOPLASTY  2020   CESAREAN SECTION  1999   2003, 2007   CHOLECYSTECTOMY  2012    Prior to Admission medications   Medication Sig Start Date End Date Taking? Authorizing Provider  Biotin 2.5 MG TABS Take by mouth.    [provider]  cholecalciferol (VITAMIN D3) 25 MCG (1000 UNIT) tablet Take 1,000 Units by mouth daily.    [provider]  Cyanocobalamin (VITAMIN B 12 PO) Take by mouth.    [provider]  MAGNESIUM CITRATE PO Take 250 mg by mouth.    [provider]  Probiotic Product (PROBIOTIC DAILY PO) Take by mouth.    [provider]    Family History  Problem Relation Age of Onset   Diabetes Sister    Hypertension Sister    Diabetes Maternal Grandmother    Stroke Maternal Grandmother    Hypertension Maternal Grandmother    Cervical cancer Neg Hx    Breast cancer Neg Hx    Colon cancer Neg Hx      Social History   Tobacco Use   Smoking status: Former    Current packs/day: 1.00    Average packs/day: 1 pack/day for 10.0 years (10.0 ttl pk-yrs)    Types: Cigarettes   Smokeless tobacco: Former  Building services engineer status: Never Used  Substance Use Topics   Alcohol use: Never   Drug use: Not Currently    Types:  Marijuana    Allergies as of 01/16/2023 - Review Complete 01/16/2023  Allergen Reaction Noted   Lamisil [terbinafine] Other (See Comments) 05/09/2021    Review of Systems:    All systems reviewed and negative except where noted in HPI.   Physical Exam:  BP 130/83   Pulse 65   Temp 98.6 F (37 C)   Ht 5\' 1"  (1.549 m)   Wt 176 lb (79.8 kg)   LMP 12/21/2022   BMI 33.25 kg/m  Patient's last menstrual period was 12/21/2022.  General:   Alert,  Well-developed, well-nourished, pleasant and cooperative in NAD Lungs:  Respirations even and unlabored.  Clear throughout to auscultation.   No wheezes, crackles, or rhonchi. No acute distress. Heart:   Regular rate and rhythm; no murmurs, clicks, rubs, or gallops. Abdomen:  Normal bowel sounds.  No bruits.  Soft, and non-distended without masses, hepatosplenomegaly or hernias noted.  Mild epigastric and Moderate LLQ tenderness.  No guarding or rebound tenderness.    Neurologic:  Alert and oriented x3;  grossly normal neurologically. Psych:  Alert and cooperative. Normal mood and affect.  Imaging Studies: No results found.  Assessment and Plan:   Kristina Parks is a 42 y.o. y/o female has been referred for:  1.  Worsening Constipation (Chronic > 2 years).  Lab: TSH  Gave samples of Linzess 72 mcg QD for 1 week, then 145 mcg QD for 1 week.  She will let me know which dose works best, and then she can call me back for a prescription. Increase to 290 dose if needed.   2.  LLQ Abdominal pain  Labs:  CBC, CMP  CT abdomen pelvis with contrast  3.  Epigastric pain  H. pylori breath test today  Pending above test results and GI symptoms, colonoscopy is the next step if symptoms persist.  Follow up in 1 month with TG.  Celso Amy, PA-C

## 2023-01-16 ENCOUNTER — Ambulatory Visit: Payer: Medicaid Other | Admitting: Physician Assistant

## 2023-01-16 ENCOUNTER — Encounter: Payer: Self-pay | Admitting: Physician Assistant

## 2023-01-16 VITALS — BP 130/83 | HR 65 | Temp 98.6°F | Ht 61.0 in | Wt 176.0 lb

## 2023-01-16 DIAGNOSIS — A048 Other specified bacterial intestinal infections: Secondary | ICD-10-CM | POA: Diagnosis not present

## 2023-01-16 DIAGNOSIS — R1032 Left lower quadrant pain: Secondary | ICD-10-CM

## 2023-01-16 DIAGNOSIS — K5909 Other constipation: Secondary | ICD-10-CM

## 2023-01-16 DIAGNOSIS — K5900 Constipation, unspecified: Secondary | ICD-10-CM

## 2023-01-16 DIAGNOSIS — R1013 Epigastric pain: Secondary | ICD-10-CM | POA: Diagnosis not present

## 2023-01-16 NOTE — Patient Instructions (Signed)
CT Abdomen /Pelvis scheduled @ 01/24/23 @ 8;30 arrival at the [facility] entrance of [facility].

## 2023-01-18 ENCOUNTER — Other Ambulatory Visit: Payer: Self-pay | Admitting: Physician Assistant

## 2023-01-18 ENCOUNTER — Encounter: Payer: Self-pay | Admitting: Physician Assistant

## 2023-01-18 ENCOUNTER — Telehealth: Payer: Self-pay

## 2023-01-18 DIAGNOSIS — B3731 Acute candidiasis of vulva and vagina: Secondary | ICD-10-CM

## 2023-01-18 LAB — COMPREHENSIVE METABOLIC PANEL
ALT: 14 [IU]/L (ref 0–32)
AST: 19 [IU]/L (ref 0–40)
Albumin: 4.4 g/dL (ref 3.9–4.9)
Alkaline Phosphatase: 45 [IU]/L (ref 44–121)
BUN/Creatinine Ratio: 19 (ref 9–23)
BUN: 14 mg/dL (ref 6–24)
Bilirubin Total: 0.6 mg/dL (ref 0.0–1.2)
CO2: 24 mmol/L (ref 20–29)
Calcium: 9.3 mg/dL (ref 8.7–10.2)
Chloride: 103 mmol/L (ref 96–106)
Creatinine, Ser: 0.74 mg/dL (ref 0.57–1.00)
Globulin, Total: 2.7 g/dL (ref 1.5–4.5)
Glucose: 94 mg/dL (ref 70–99)
Potassium: 4.5 mmol/L (ref 3.5–5.2)
Sodium: 139 mmol/L (ref 134–144)
Total Protein: 7.1 g/dL (ref 6.0–8.5)
eGFR: 103 mL/min/{1.73_m2} (ref >59)

## 2023-01-18 LAB — CBC WITH DIFFERENTIAL/PLATELET
Basophils Absolute: 0 10*3/uL (ref 0.0–0.2)
Basos: 1 %
EOS (ABSOLUTE): 0 10*3/uL (ref 0.0–0.4)
Eos: 0 %
Hematocrit: 39.9 % (ref 34.0–46.6)
Hemoglobin: 12.4 g/dL (ref 11.1–15.9)
Immature Grans (Abs): 0 10*3/uL (ref 0.0–0.1)
Immature Granulocytes: 0 %
Lymphocytes Absolute: 2.5 10*3/uL (ref 0.7–3.1)
Lymphs: 55 %
MCH: 28.7 pg (ref 26.6–33.0)
MCHC: 31.1 g/dL — ABNORMAL LOW (ref 31.5–35.7)
MCV: 92 fL (ref 79–97)
Monocytes Absolute: 0.3 10*3/uL (ref 0.1–0.9)
Monocytes: 8 %
Neutrophils Absolute: 1.6 10*3/uL (ref 1.4–7.0)
Neutrophils: 36 %
Platelets: 341 10*3/uL (ref 150–450)
RBC: 4.32 x10E6/uL (ref 3.77–5.28)
RDW: 13.4 % (ref 11.7–15.4)
WBC: 4.5 10*3/uL (ref 3.4–10.8)

## 2023-01-18 LAB — TSH: TSH: 1.13 u[IU]/mL (ref 0.450–4.500)

## 2023-01-18 LAB — H. PYLORI BREATH TEST: H pylori Breath Test: POSITIVE — AB

## 2023-01-18 MED ORDER — OMEPRAZOLE 20 MG PO CPDR: 20.0000 mg | DELAYED_RELEASE_CAPSULE | Freq: Two times a day (BID) | ORAL | 0 refills | Status: DC

## 2023-01-18 MED ORDER — CLARITHROMYCIN 500 MG PO TABS
500.0000 mg | ORAL_TABLET | Freq: Two times a day (BID) | ORAL | 0 refills | Status: AC
Start: 2023-01-18 — End: 2023-02-01

## 2023-01-18 MED ORDER — FLUCONAZOLE 100 MG PO TABS: 100.0000 mg | ORAL_TABLET | Freq: Every day | ORAL | 1 refills | Status: AC

## 2023-01-18 MED ORDER — AMOXICILLIN 500 MG PO TABS
1000.0000 mg | ORAL_TABLET | Freq: Two times a day (BID) | ORAL | 0 refills | Status: AC
Start: 2023-01-18 — End: 2023-02-01

## 2023-01-18 NOTE — Progress Notes (Signed)
Call and notify pt. Labs show: 1. Normal CBC, CMP and TSH. 2. H. Pylori Test is Positive.  Please start treatment for H Pylori for 14days:            Omeprazole 20mg  BID            Clarithromycin 500mg  BID             Amoxicillin 1gm BID Need follow-up appointment as scheduled.  We will plan to retest for H. pylori 4 weeks after she has completed treatment. Celso Amy, PA-C

## 2023-01-18 NOTE — Progress Notes (Signed)
We recently sent amoxicillin, clarithromycin, omeprazole to patient's pharmacy to treat H. pylori.  Patient sent me a message requesting Diflucan for yeast infection.  She states she gets yeast infection when taking antibiotics.  I sent prescription Diflucan to her pharmacy per patient request. Celso Amy, PA-C

## 2023-01-18 NOTE — Telephone Encounter (Signed)
Patient verbalized understanding of results. Sent medication to pharmacy and put a reminder for 6 weeks repeat labs

## 2023-01-18 NOTE — Telephone Encounter (Signed)
-----   Message from Celso Amy sent at 01/18/2023  9:58 AM EST ----- Call and notify pt. Labs show: 1. Normal CBC, CMP and TSH. 2. H. Pylori Test is Positive.  Please start treatment for H Pylori for 14days:            Omeprazole 20mg  BID            Clarithromycin 500mg  BID             Amoxicillin 1gm BID Need follow-up appointment as scheduled.  We will plan to retest for H. pylori 4 weeks after she has completed treatment. Celso Amy, PA-C

## 2023-01-24 ENCOUNTER — Ambulatory Visit: Payer: Medicaid Other

## 2023-01-25 ENCOUNTER — Ambulatory Visit
Admission: RE | Admit: 2023-01-25 | Discharge: 2023-01-25 | Disposition: A | Discharge status: Home / Self Care | Payer: Medicaid Other | Source: Ambulatory Visit | Attending: Physician Assistant | Admitting: Physician Assistant

## 2023-01-25 DIAGNOSIS — R1013 Epigastric pain: Secondary | ICD-10-CM | POA: Diagnosis not present

## 2023-01-25 DIAGNOSIS — R1032 Left lower quadrant pain: Secondary | ICD-10-CM | POA: Diagnosis not present

## 2023-01-25 DIAGNOSIS — N200 Calculus of kidney: Secondary | ICD-10-CM | POA: Diagnosis not present

## 2023-01-25 DIAGNOSIS — K5730 Diverticulosis of large intestine without perforation or abscess without bleeding: Secondary | ICD-10-CM | POA: Diagnosis not present

## 2023-01-25 MED ORDER — IOHEXOL 300 MG/ML  SOLN
100.0000 mL | Freq: Once | INTRAMUSCULAR | Status: AC | PRN
  Administered 2023-01-25: 100 mL via INTRAVENOUS

## 2023-01-28 ENCOUNTER — Encounter: Payer: Self-pay | Admitting: Physician Assistant

## 2023-02-01 ENCOUNTER — Ambulatory Visit: Payer: Medicaid Other | Admitting: Physician Assistant

## 2023-02-06 ENCOUNTER — Telehealth: Payer: Medicaid Other | Admitting: Family Medicine

## 2023-02-06 ENCOUNTER — Encounter: Payer: Self-pay | Admitting: Family Medicine

## 2023-02-06 DIAGNOSIS — L309 Dermatitis, unspecified: Secondary | ICD-10-CM

## 2023-02-06 DIAGNOSIS — L578 Other skin changes due to chronic exposure to nonionizing radiation: Secondary | ICD-10-CM
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
## 2023-02-11 ENCOUNTER — Encounter: Payer: Self-pay | Admitting: Family Medicine

## 2023-02-20 ENCOUNTER — Encounter: Payer: Self-pay | Admitting: Dermatology

## 2023-02-20 ENCOUNTER — Ambulatory Visit: Payer: Medicaid Other | Admitting: Physician Assistant

## 2023-02-20 ENCOUNTER — Ambulatory Visit (INDEPENDENT_AMBULATORY_CARE_PROVIDER_SITE_OTHER): Payer: Medicaid Other | Admitting: Dermatology

## 2023-02-20 ENCOUNTER — Encounter: Payer: Self-pay | Admitting: Physician Assistant

## 2023-02-20 VITALS — BP 108/71 | HR 48 | Temp 97.7°F | Ht 61.0 in

## 2023-02-20 DIAGNOSIS — Z7189 Other specified counseling: Secondary | ICD-10-CM

## 2023-02-20 DIAGNOSIS — K5909 Other constipation: Secondary | ICD-10-CM

## 2023-02-20 DIAGNOSIS — Z79899 Other long term (current) drug therapy: Secondary | ICD-10-CM

## 2023-02-20 DIAGNOSIS — R194 Change in bowel habit: Secondary | ICD-10-CM

## 2023-02-20 DIAGNOSIS — L819 Disorder of pigmentation, unspecified: Secondary | ICD-10-CM | POA: Diagnosis not present

## 2023-02-20 DIAGNOSIS — K219 Gastro-esophageal reflux disease without esophagitis: Secondary | ICD-10-CM | POA: Diagnosis not present

## 2023-02-20 DIAGNOSIS — R1032 Left lower quadrant pain: Secondary | ICD-10-CM | POA: Diagnosis not present

## 2023-02-20 DIAGNOSIS — R142 Eructation: Secondary | ICD-10-CM

## 2023-02-20 DIAGNOSIS — K5904 Chronic idiopathic constipation: Secondary | ICD-10-CM

## 2023-02-20 DIAGNOSIS — L6681 Central centrifugal cicatricial alopecia: Secondary | ICD-10-CM

## 2023-02-20 DIAGNOSIS — L810 Postinflammatory hyperpigmentation: Secondary | ICD-10-CM | POA: Diagnosis not present

## 2023-02-20 DIAGNOSIS — A048 Other specified bacterial intestinal infections: Secondary | ICD-10-CM | POA: Diagnosis not present

## 2023-02-20 MED ORDER — PEG 3350-KCL-NA BICARB-NACL 420 G PO SOLR: 4000.0000 mL | Freq: Once | ORAL | 0 refills | Status: AC

## 2023-02-20 MED ORDER — DOXYCYCLINE HYCLATE 50 MG PO CAPS: 50.0000 mg | ORAL_CAPSULE | Freq: Every day | ORAL | 1 refills | Status: AC

## 2023-02-20 MED ORDER — MOMETASONE FUROATE 0.1 % EX SOLN: CUTANEOUS | 5 refills | Status: AC

## 2023-02-20 MED ORDER — MINOXIDIL 2.5 MG PO TABS: ORAL_TABLET | ORAL | 1 refills | Status: AC

## 2023-02-20 MED ORDER — AZELAIC ACID 20 % EX CREA: TOPICAL_CREAM | CUTANEOUS | 3 refills | Status: DC

## 2023-02-20 MED ORDER — KETOCONAZOLE 2 % EX SHAM: MEDICATED_SHAMPOO | CUTANEOUS | 11 refills | Status: AC

## 2023-02-20 NOTE — Progress Notes (Signed)
Celso Amy, PA-C [address]  [address]  [place], [place] [address]  Main: [phone]  Fax: [phone]   Primary Care Physician: Smitty Cords, DO  Primary Gastroenterologist:  Celso Amy, PA-C / Dr. Wyline Mood    CC: F/U Constipation, LLQ Pain, H. Pylori  HPI: Kristina Parks is a 42 y.o. female returns for 1 month follow-up of LLQ pain, constipation, and H. pylori infection.  Unable to tolerate MiraLAX due to headaches.  Chronic constipation for many years.  Uses Preparation H for hemorrhoids.  1 month ago she was given samples of Linzess 72 and 145 mcg.  Linzess 72 worked okay.  Linzess 145 caused severe diarrhea and she discontinued.  Currently she is taking fiber Gummies and psyllium husk with benefit.  Currently having a bowel movement every other day.  Constipation has improved.  She denies rectal bleeding.  Has had chronic LLQ pain for 1 year.  She is requesting to have a colonoscopy.  She has history of abdominoplasty, C-sections, and cholecystectomy.  01/16/2023 labs: Normal CBC, CMP, and TSH.  H. pylori test was positive.  Started on treatment with amoxicillin 1 g twice daily, clarithromycin 500 Mg twice daily, and omeprazole 20 Mg twice daily for 14 days.  Plan to recheck H. pylori test 4 weeks after completing treatment.  Her belching and upper abdominal bloating improved post treatment.  She has had a few episodes of heartburn.  No current PPI.  01/25/2023 CT abdomen pelvis: Diarrhea state (on Linzess 145).  Loose stool in the colon.  Mild sigmoid diverticulosis.  Small 3 mm nonobstructing right kidney stone.  No other acute abnormality to explain LLQ pain.  No diverticulitis.  Current Outpatient Medications  Medication Sig Dispense Refill   Biotin 2.5 MG TABS Take by mouth.     cholecalciferol (VITAMIN D3) 25 MCG (1000 UNIT) tablet Take 1,000 Units by mouth daily.     Cyanocobalamin (VITAMIN B 12 PO) Take by mouth.     MAGNESIUM CITRATE PO Take 250  mg by mouth.     polyethylene glycol-electrolytes (NULYTELY) 420 g solution Take 4,000 mLs by mouth once for 1 dose. Use as directed for your colonoscopy preparation 4000 mL 0   Probiotic Product (PROBIOTIC DAILY PO) Take by mouth.     azelaic acid (AZELEX) 20 % cream Apply to aa face QHS. 30 g 3   doxycycline (VIBRAMYCIN) 50 MG capsule Take 1 capsule (50 mg total) by mouth daily. Take with dinner meal 90 capsule 1   ketoconazole (NIZORAL) 2 % shampoo Shampoo into scalp let sit 5-10 minutes then wash out. Use 3d/wk. 120 mL 11   minoxidil (LONITEN) 2.5 MG tablet Take 1/2 tab po QD. 45 tablet 1   mometasone (ELOCON) 0.1 % lotion Apply to the scalp QD 5d/wk. 60 mL 5   omeprazole (PRILOSEC) 20 MG capsule Take 1 capsule (20 mg total) by mouth 2 (two) times daily before a meal for 14 days. 28 capsule 0   No current facility-administered medications for this visit.    Allergies as of 02/20/2023 - Review Complete 02/20/2023  Allergen Reaction Noted   Lamisil [terbinafine] Other (See Comments) 05/09/2021    Past Medical History:  Diagnosis Date   Diverticulosis    Fibroids     Past Surgical History:  Procedure Laterality Date   ABDOMINOPLASTY  2020   CESAREAN SECTION  1999   2003, 2007   CHOLECYSTECTOMY  2012    Review of Systems:  All systems reviewed and negative except where noted in HPI.   Physical Examination:   BP 108/71   Pulse (!) 48   Temp 97.7 F (36.5 C)   Ht 5\' 1"  (1.549 m)   BMI 33.25 kg/m   General: Well-nourished, well-developed in no acute distress.  Lungs: Clear to auscultation bilaterally. Non-labored. Heart: Regular rate and rhythm, no murmurs rubs or gallops.  Abdomen: Bowel sounds are normal; Abdomen is Soft; No hepatosplenomegaly, masses or hernias;  No Abdominal Tenderness; No guarding or rebound tenderness. Neuro: Alert and oriented x 3.  Grossly intact.  Psych: Alert and cooperative, normal mood and affect.   Imaging Studies: CT ABDOMEN PELVIS W  CONTRAST  Result Date: 01/27/2023 CLINICAL DATA:  Left lower quadrant abdominal pain. EXAM: CT ABDOMEN AND PELVIS WITH CONTRAST TECHNIQUE: Multidetector CT imaging of the abdomen and pelvis was performed using the standard protocol following bolus administration of intravenous contrast. RADIATION DOSE REDUCTION: This exam was performed according to the departmental dose-optimization program which includes automated exposure control, adjustment of the mA and/or kV according to patient size and/or use of iterative reconstruction technique. CONTRAST:  OMNIPAQUE IOHEXOL 300 MG/ML  SOLN COMPARISON:  None Available. FINDINGS: Lower chest: The visualized lung bases are clear. No intra-abdominal free air.  Trace free fluid within the pelvis. Hepatobiliary: The liver is unremarkable. No biliary dilatation. Cholecystectomy. No retained calcified stone noted in the central CBD. Pancreas: Unremarkable. No pancreatic ductal dilatation or surrounding inflammatory changes. Spleen: Normal in size without focal abnormality. Adrenals/Urinary Tract: The adrenal glands are unremarkable. There is a 3 mm nonobstructing right renal interpolar calculus. No hydronephrosis. The left kidney is unremarkable. The visualized ureters and urinary bladder appear unremarkable. Stomach/Bowel: There is loose stool throughout the colon consistent with diarrheal state. Correlation with clinical exam and stool cultures recommended. Mild sigmoid diverticulosis without active inflammatory changes. There is no bowel obstruction. The appendix is normal. Vascular/Lymphatic: The abdominal aorta and IVC unremarkable. No portal venous gas. There is no adenopathy. Reproductive: The uterus is anteverted.  No adnexal masses. Other: Anterior pelvic wall C-section scar. Musculoskeletal: No acute or significant osseous findings. IMPRESSION: 1. Diarrheal state. Correlation with clinical exam and stool cultures recommended. 2. Mild sigmoid diverticulosis. No  bowel obstruction. Normal appendix. 3. A 3 mm nonobstructing right renal interpolar calculus. No hydronephrosis. Electronically Signed   By: Elgie Collard M.D.   On: 01/27/2023 23:07    Assessment and Plan:   Kristina Parks is a 42 y.o. y/o female returns for follow-up of  1.  H. pylori infection -recently treated with amoxicillin, clarithromycin, and omeprazole.  Repeat H. pylori breath test off PPI in 4 weeks.  2.  Chronic constipation  She cannot tolerate MiraLAX which caused headaches.  Cannot tolerate Linzess which caused diarrhea.  Continue fiber Gummies and psyllium husk fiber supplement.  Add OTC Colace stool softener 100 mg daily or Senokot 8.6 Mg 2 tablets daily if needed.  Drink 64 ounces of fluids daily.  3.  LLQ Pain - Recent Abd / Pelvic CT showed NO diverticulitis or other abnormality to explain abd pain.  Suspect Scar tissue / adhesions from prior abdominal surgery.  4.  Change in Bowel habits  Per patient request, we will go ahead and schedule a Colonoscopy.  Scheduling Colonoscopy I discussed risks of colonoscopy with patient to include risk of bleeding, colon perforation, and risk of sedation.  Patient expressed understanding and agrees to proceed with colonoscopy.   5.  Belching / GERD  Start OTC Pepcid 20mg  1 tablet twice daily.  Avoid sodas and carbonated drinks.   Celso Amy, PA-C  Follow up as needed based on colonoscopy results and GI symptoms.

## 2023-02-20 NOTE — Progress Notes (Signed)
New Patient Visit   Subjective  Kristina Parks is a 42 y.o. female who presents for the following: Hair loss started 4 years ago, has gradually become worse, started around the time she lost her husband, now she is almost completely bald. Hyperpigmentation under the eyes which started in October 2024, pt would like to discuss treatment options. She does wear goggles when she rides her bicycle.   The following portions of the chart were reviewed this encounter and updated as appropriate: medications, allergies, medical history  Review of Systems:  No other skin or systemic complaints except as noted in HPI or Assessment and Plan.  Objective  Well appearing patient in no apparent distress; mood and affect are within normal limits.   A focused examination was performed of the following areas: the face and scalp   Relevant exam findings are noted in the Assessment and Plan.     Assessment & Plan    CENTRAL CENTRIFUGAL CICATRICIAL ALOPECIA Scalp Not curable and all hair will not grow back, but treatable and treatment may help at least some.  Consider bx at follow up if not improving -  Start Doxycycline 50 mg po QHS with evening meal. Doxycycline should be taken with food to prevent nausea. Do not lay down for 30 minutes after taking. Be cautious with sun exposure and use good sun protection while on this medication. Pregnant women should not take this medication.   Start Ketoconazole 2% shampoo 3d/wk.   Start Mometasone lotion QHS 5d/wk. Topical steroids (such as triamcinolone, fluocinolone, fluocinonide, mometasone, clobetasol, halobetasol, betamethasone, hydrocortisone) can cause thinning and lightening of the skin if they are used for too long in the same area. Your physician has selected the right strength medicine for your problem and area affected on the body. Please use your medication only as directed by your physician to prevent side effects.   Start Minoxidil 2.5mg  1/2  tab po QAM. Doses of minoxidil for hair loss are considered 'low dose'. This is because the doses used for hair loss are much lower than the doses which are used for conditions such as high blood pressure (hypertension). The doses used for hypertension are 10-40mg  per day.  Side effects are uncommon at the low doses (up to 2.5 mg/day) used to treat hair loss. Potential side effects, more commonly seen at higher doses, include: Increase in hair growth (hypertrichosis) elsewhere on face and body Temporary hair shedding upon starting medication which may last up to 4 weeks Ankle swelling, fluid retention, rapid weight gain more than 5 pounds Low blood pressure and feeling lightheaded or dizzy when standing up quickly Fast or irregular heartbeat Headaches   MEDICATION MANAGEMENT   COUNSELING AND COORDINATION OF CARE   DYSCHROMIA   POST-INFLAMMATORY HYPERPIGMENTATION (PIH) Infraorbital areas         Exam: hyperpigmented macules and/or patches at face   This is a benign condition that comes from having previous inflammation in the skin and will fade with time over months to sometimes years. Recommend daily sun protection including sunscreen SPF 30+ to sun-exposed areas. - Recommend treating any itchy or red areas on the skin quickly to prevent new areas of PIH. Treating with prescription medicines such as hydroquinone may help fade dark spots faster.    Treatment Plan:  Start Azelaic acid at bedtime.   Return in about 3 months (around 05/21/2023) for hair loss follow up.  Maylene Roes, CMA, am acting as scribe for Armida Sans, MD .  Documentation: I have reviewed the above documentation for accuracy and completeness, and I agree with the above.  Armida Sans, MD

## 2023-02-20 NOTE — Patient Instructions (Signed)

## 2023-02-20 NOTE — Patient Instructions (Addendum)
For Belching: Start OTC Pepcid 20mg  1 tablet twice daily. Avoid sodas and carbonated drinks.  For Constipation: Take OTC Colace stool softener 100mg  1 capsule daily OR OTC Senokot 8.6mg  2 tablets once or twice daily.  Return to our office in 2-4 weeks to do repeat H. Pylori Breath Test.

## 2023-02-22 ENCOUNTER — Encounter: Payer: Self-pay | Admitting: Dermatology

## 2023-02-22 ENCOUNTER — Ambulatory Visit: Payer: Medicaid Other | Admitting: Physician Assistant

## 2023-02-28 ENCOUNTER — Telehealth: Payer: Self-pay

## 2023-02-28 NOTE — Telephone Encounter (Signed)
Pt lvm requesting to reschedule her colonoscopy to a sooner date than 04/05/23 but still keep it on a Friday.  Returned phone call to let her know unfortunately Fridays fill up fast and this is the earliest Friday date we have available.  Asked her to call office back to speak with [facility] if she has any other questions.  Message forwarded to [facility] to let her know that I've spoken to patient.  Thanks, [place], [place]

## 2023-03-01 ENCOUNTER — Telehealth: Payer: Self-pay

## 2023-03-01 DIAGNOSIS — A048 Other specified bacterial intestinal infections: Secondary | ICD-10-CM

## 2023-03-01 NOTE — Telephone Encounter (Signed)
-----   Message from [facility] Nedrow H sent at 01/18/2023 10:27 AM EST ----- We will plan to retest for H. pylori 4 weeks after she has completed treatment.

## 2023-03-01 NOTE — Telephone Encounter (Signed)
Patient verbalized understanding she was due for repeat lab. She understood we did not have lab next week but she could [facility] the [facility].

## 2023-03-04 DIAGNOSIS — A048 Other specified bacterial intestinal infections: Secondary | ICD-10-CM | POA: Diagnosis not present

## 2023-03-06 LAB — H. PYLORI BREATH TEST: H pylori Breath Test: NEGATIVE

## 2023-03-15 ENCOUNTER — Telehealth: Payer: Self-pay

## 2023-03-15 NOTE — Telephone Encounter (Signed)
 Left detailed message on patients VM. Hi Ms. Coplen, Your repeat H. pylori breath test is negative.  This is great news!  Previous H. pylori infection is gone after treatment with antibiotics.  No further treatment is necessary.  Continue with current plan. Ellouise Console, PA-C

## 2023-03-29 ENCOUNTER — Encounter: Payer: Self-pay | Admitting: Family Medicine

## 2023-03-29 DIAGNOSIS — Z1231 Encounter for screening mammogram for malignant neoplasm of breast: Secondary | ICD-10-CM

## 2023-04-01 ENCOUNTER — Ambulatory Visit (INDEPENDENT_AMBULATORY_CARE_PROVIDER_SITE_OTHER): Payer: Medicaid Other | Admitting: Family Medicine

## 2023-04-01 ENCOUNTER — Encounter: Payer: Self-pay | Admitting: Family Medicine

## 2023-04-01 ENCOUNTER — Ambulatory Visit
Admission: RE | Admit: 2023-04-01 | Discharge: 2023-04-01 | Disposition: A | Discharge status: Home / Self Care | Payer: 59 | Source: Ambulatory Visit | Attending: Family Medicine | Admitting: Family Medicine

## 2023-04-01 VITALS — BP 110/68 | HR 70 | Ht 61.0 in | Wt 184.0 lb

## 2023-04-01 DIAGNOSIS — Z1231 Encounter for screening mammogram for malignant neoplasm of breast: Secondary | ICD-10-CM | POA: Diagnosis present

## 2023-04-01 DIAGNOSIS — E559 Vitamin D deficiency, unspecified: Secondary | ICD-10-CM | POA: Diagnosis not present

## 2023-04-01 DIAGNOSIS — Z833 Family history of diabetes mellitus: Secondary | ICD-10-CM

## 2023-04-01 DIAGNOSIS — E66811 Obesity, class 1: Secondary | ICD-10-CM | POA: Diagnosis not present

## 2023-04-01 DIAGNOSIS — Z83438 Family history of other disorder of lipoprotein metabolism and other lipidemia: Secondary | ICD-10-CM | POA: Diagnosis not present

## 2023-04-01 DIAGNOSIS — Z0000 Encounter for general adult medical examination without abnormal findings: Secondary | ICD-10-CM | POA: Diagnosis not present

## 2023-04-01 NOTE — Patient Instructions (Addendum)
Thank you for coming to the office today.  Zepbound TO Check Cost & Coverage of Zepbound Please contact Research officer, trade union (manufacturer for [employer]) 1-800-LillyRx ([phone]) - Live agent to discuss cost and coverage.  [email or web address]  For Weight Loss / Obesity only  Contrave - oral medication, appetite suppression has wellbutrin/bupropion and naltrexone in it and it can also help with appetite, it is ordered through a speciality pharmacy. - $99 per month  Free sample 7 day, 1 pill per day for 1 week  Alternative options  Semaglutide injection (mixed Ozempic) from [facility] 0.25mg  weekly for 4 weeks then increase to 0.5mg  weekly It comes in a vial and a needle syringe, you need to draw up the shot and self admin it weekly Cost is about $200-300 per month Call them to check pricing and availability  [facility] Address: [address] [place], [place], [place] [address] Phone: [phone]  --------------------------------  [facility] Online Virtual Doctor Team With insurance $79/mo + copay for medications Without insurance $79/mo + $99/mo to include medication (Semaglutide) Without insurance $79/mo + $199/mo to include medication (Tirzepatide) [email or web address]    -------------------  Mammogram done today, results pending.  Last Pap Smear January 2022, next due 3-5 years, call when ready to schedule / next routine pap smear visit. If you need a referral. Let me know.  [facility] at [facility] [address] [place],  [place]  [address] Main: [phone]   Future consider Coronary Calcium Score Cardiac CT Scan. This is a screening test for patients aged 45-50+ with cardiovascular risk factors or who are healthy but would be interested in Cardiovascular Screening for heart disease. Even if there is a family history of heart disease, this imaging can  be useful. Typically it can be done every 5+ years or at a different timeline we agree on  The scan will look at the chest and mainly focus on the heart and identify early signs of calcium build up or blockages within the heart arteries. It is not 100% accurate for identifying blockages or heart disease, but it is useful to help [place] predict who may have some early changes or be at risk in the future for a heart attack or cardiovascular problem.  The results are reviewed by a Cardiologist and they will document the results. It should become available on MyChart. Typically the results are divided into percentiles based on other patients of the same demographic and age. So it will compare your risk to others similar to you. If you have a higher score >99 or higher percentile >75%tile, it is recommended to consider Statin cholesterol therapy and or referral to Cardiologist. I will try to help explain your results and if we have questions we can contact the Cardiologist.  You will be contacted for scheduling. Usually it is done at any imaging facility through [facility], [facility] or [facility].  The cost is $99 flat fee total and it does not go through insurance, so no authorization is required.   --------------------------------  Sleep Hygiene Recommendations to promote healthy sleep in all patients, especially if symptoms of insomnia are worsening. Due to the nature of sleep rhythms, if your body gets "out of rhythm", it may take some time before your sleep cycle can be "reset".  Please try to follow as many of the following tips as you can, usually there are only a few of these are the primary cause of the problem.  ?To  reset your sleep rhythm, go to bed and get up at the same time every day ?Sleep only long enough to feel rested and then get out of bed ?Do not try to force yourself to sleep. If you can't sleep, get out of bed and try again later. ?Avoid naps  during the day, unless excessively tired. The more sleeping during the day, then the less sleep your body needs at night.  ?Have coffee, tea, and other foods that have caffeine only in the morning ?Exercise several days a week, but not right before bed ?If you drink alcohol, prefer to have appropriate drink with one meal, but prefer to avoid alcohol in the evening, and bedtime ?If you smoke, avoid smoking, especially in the evening  ?Avoid watching TV or looking at phones, computers, or reading devices ("e-books") that give off light at least 30 minutes before bed. This artificial light sends "awake signals" to your brain and can make it harder to fall asleep. ?Make your bedroom a comfortable place where it is easy to fall asleep: Put up shades or special blackout curtains to block light from outside. Use a white noise machine to block noise. Keep the temperature cool. ?Try your best to solve or at least address your problems before you go to bed ?Use relaxation techniques to manage stress. Ask your health care provider to suggest some techniques that may work well for you. These may include: Breathing exercises. Routines to release muscle tension. Visualizing peaceful scenes.    Please schedule a Follow-up Appointment to: Return if symptoms worsen or fail to improve.  If you have any other questions or concerns, please feel free to call the office or send a message through MyChart. You may also schedule an earlier appointment if necessary.  Additionally, you may be receiving a survey about your experience at our office within a few days to 1 week by e-mail or mail. We value your feedback.  Saralyn Pilar, DO [facility], [place]

## 2023-04-01 NOTE — Progress Notes (Signed)
Subjective:    Patient ID: Kristina Parks, female    DOB: 1980-04-08, 43 y.o.   MRN: [id number]  Kristina Parks is a 43 y.o. female presenting on 04/01/2023 for Annual Exam   HPI  Discussed the use of AI scribe software for clinical note transcription with the patient, who gave verbal consent to proceed.  History of Present Illness    Perimenopausal Concerns Insomnia / Stressors The patient expressed concerns about difficulty sleeping, which she attributed to insomnia and stress with perimenopausal symptoms. She reported a family history of early menopause, with her mother experiencing symptoms in her late thirties and her grandmother in her forties. She has tried sleep hygiene and melatonin. She asks about possibility of high cortisol due to stress and other factors. The patient has a history of high stress, particularly following the death of her spouse from COVID-19 in 2021 Not having awakening or sleep apnea symptoms  Obesity BMI >34 Lifestyle The patient also mentioned a recent return to full-time work, which has significantly reduced her exercise time from ten hours per week to approximately one and a half hours. Despite attempts to adjust her diet to accommodate decreased activity, the patient expressed interest in medication assistance for weight loss, albeit with concerns about potential weight regain after discontinuation of the medication. Winter months, less cycling outside. Less physical activity exercise Now working full time - Mon-Fri, reduced exercise time for cycling, previously 10 hr a week now 1-2 hour Diet improved some, but still has episodic fluctuations carb cravings at time. Treadmill and some incline and walking, jogging during lunch breaks and weekend Sat / Sun  Abdominal Pain / GERD / History of H Pylori Followed by [facility] The patient also reported persistent lower gastric pain despite a positive diagnosis and treatment for H. Pylori, which resolved  her upper gastric pain. She has proactively scheduled a colonoscopy due to ongoing complications.    Fam history of Diabetes, HLD, Thyroid    Health Maintenance: Prior HIV screening 11/2021, negative  UTD Flu Vaccine 12/2021   Declines Hep C screen   Mammogram updated today 04/01/23, results are pending.  Cervical Cancer Screening last pap 03/2020 [facility]. Next due 3-5 years.     03/07/2022    9:09 AM 03/02/2020    3:17 PM  Depression screen PHQ 2/9  Decreased Interest 0 0  Down, Depressed, Hopeless 0 0  PHQ - 2 Score 0 0  Altered sleeping 0   Tired, decreased energy 0   Change in appetite 0   Feeling bad or failure about yourself  0   Trouble concentrating 0   Moving slowly or fidgety/restless 0   Suicidal thoughts 0   PHQ-9 Score 0   Difficult doing work/chores Not difficult at all        03/07/2022    9:09 AM  GAD 7 : Generalized Anxiety Score  Nervous, Anxious, on Edge 0  Control/stop worrying 0  Worry too much - different things 0  Trouble relaxing 0  Restless 0  Easily annoyed or irritable 1  Afraid - awful might happen 1  Total GAD 7 Score 2  Anxiety Difficulty Not difficult at all     Past Medical History:  Diagnosis Date   Diverticulosis    Fibroids    Past Surgical History:  Procedure Laterality Date   ABDOMINOPLASTY  2020   CESAREAN SECTION  1999   2003, 2007   CHOLECYSTECTOMY  2012   Social History   Socioeconomic  History   Marital status: Widowed    Spouse name: Not on file   Number of children: Not on file   Years of education: Not on file   Highest education level: Master's degree (e.g., MA, MS, MEng, MEd, MSW, MBA)  Occupational History   Not on file  Tobacco Use   Smoking status: Former    Current packs/day: 1.00    Average packs/day: 1 pack/day for 10.0 years (10.0 ttl pk-yrs)    Types: Cigarettes   Smokeless tobacco: Former  Building services engineer status: Never Used  Substance and Sexual Activity   Alcohol use: Never    Drug use: Not Currently    Types: Marijuana   Sexual activity: Not on file  Other Topics Concern   Not on file  Social History Narrative   Not on file   Social Drivers of Health   Financial Resource Strain: Low Risk  (04/01/2023)   Overall Financial Resource Strain (CARDIA)    Difficulty of Paying Living Expenses: Not hard at all  Food Insecurity: No Food Insecurity (04/01/2023)   Hunger Vital Sign    Worried About Running Out of Food in the Last Year: Never true    Ran Out of Food in the Last Year: Never true  Transportation Needs: No Transportation Needs (04/01/2023)   PRAPARE - Administrator, Civil Service (Medical): No    Lack of Transportation (Non-Medical): No  Physical Activity: Sufficiently Active (04/01/2023)   Exercise Vital Sign    Days of Exercise per Week: 6 days    Minutes of Exercise per Session: 40 min  Stress: Stress Concern Present (04/01/2023)   [employer] of Occupational Health - Occupational Stress Questionnaire    Feeling of Stress : To some extent  Social Connections: Moderately Isolated (04/01/2023)   Social Connection and Isolation Panel [NHANES]    Frequency of Communication with Friends and Family: Once a week    Frequency of Social Gatherings with Friends and Family: Never    Attends Religious Services: More than 4 times per year    Active Member of [employer] or Organizations: Yes    Attends Banker Meetings: More than 4 times per year    Marital Status: Widowed  Catering manager Violence: Not on file   Family History  Problem Relation Age of Onset   Diabetes Sister    Hypertension Sister    Diabetes Maternal Grandmother    Stroke Maternal Grandmother    Hypertension Maternal Grandmother    Congestive Heart Failure Maternal Grandmother 50 - 59   Cervical cancer Neg Hx    Breast cancer Neg Hx    Colon cancer Neg Hx    Current Outpatient Medications on File Prior to Visit  Medication Sig   Biotin 2.5 MG TABS Take by  mouth.   cholecalciferol (VITAMIN D3) 25 MCG (1000 UNIT) tablet Take 1,000 Units by mouth daily.   Cyanocobalamin (VITAMIN B 12 PO) Take by mouth.   doxycycline (VIBRAMYCIN) 50 MG capsule Take 1 capsule (50 mg total) by mouth daily. Take with dinner meal   ketoconazole (NIZORAL) 2 % shampoo Shampoo into scalp let sit 5-10 minutes then wash out. Use 3d/wk.   MAGNESIUM CITRATE PO Take 250 mg by mouth.   minoxidil (LONITEN) 2.5 MG tablet Take 1/2 tab po QD.   mometasone (ELOCON) 0.1 % lotion Apply to the scalp QD 5d/wk.   Probiotic Product (PROBIOTIC DAILY PO) Take by mouth.   No current facility-administered  medications on file prior to visit.    Review of Systems  Constitutional:  Negative for activity change, appetite change, chills, diaphoresis, fatigue and fever.  HENT:  Negative for congestion and hearing loss.   Eyes:  Negative for visual disturbance.  Respiratory:  Negative for cough, chest tightness, shortness of breath and wheezing.   Cardiovascular:  Negative for chest pain, palpitations and leg swelling.  Gastrointestinal:  Negative for abdominal pain, constipation, diarrhea, nausea and vomiting.  Genitourinary:  Negative for dysuria, frequency and hematuria.  Musculoskeletal:  Negative for arthralgias and neck pain.  Skin:  Negative for rash.  Neurological:  Negative for dizziness, weakness, light-headedness, numbness and headaches.  Hematological:  Negative for adenopathy.  Psychiatric/Behavioral:  Negative for behavioral problems, dysphoric mood and sleep disturbance.    Per HPI unless specifically indicated above     Objective:    BP 110/68   Pulse 70   Ht 5\' 1"  (1.549 m)   Wt 184 lb (83.5 kg)   LMP 03/12/2023   SpO2 100%   BMI 34.77 kg/m   Wt Readings from Last 3 Encounters:  04/01/23 184 lb (83.5 kg)  01/16/23 176 lb (79.8 kg)  11/27/22 177 lb (80.3 kg)    Physical Exam Vitals and nursing note reviewed.  Constitutional:      General: She is not in acute  distress.    Appearance: She is well-developed. She is obese. She is not diaphoretic.     Comments: Well-appearing, comfortable, cooperative  HENT:     Head: Normocephalic and atraumatic.     Right Ear: Tympanic membrane, ear canal and external ear normal. There is no impacted cerumen.     Left Ear: Tympanic membrane, ear canal and external ear normal. There is no impacted cerumen.  Eyes:     General:        Right eye: No discharge.        Left eye: No discharge.     Conjunctiva/sclera: Conjunctivae normal.     Pupils: Pupils are equal, round, and reactive to light.  Neck:     Thyroid: No thyromegaly.     Vascular: No carotid bruit.  Cardiovascular:     Rate and Rhythm: Normal rate and regular rhythm.     Pulses: Normal pulses.     Heart sounds: Normal heart sounds. No murmur heard. Pulmonary:     Effort: Pulmonary effort is normal. No respiratory distress.     Breath sounds: Normal breath sounds. No wheezing or rales.  Abdominal:     General: Bowel sounds are normal. There is no distension.     Palpations: Abdomen is soft. There is no mass.     Tenderness: There is no abdominal tenderness.  Musculoskeletal:        General: No tenderness. Normal range of motion.     Cervical back: Normal range of motion and neck supple.     Right lower leg: No edema.     Left lower leg: No edema.     Comments: Upper / Lower Extremities: - Normal muscle tone, strength bilateral upper extremities 5/5, lower extremities 5/5  Lymphadenopathy:     Cervical: No cervical adenopathy.  Skin:    General: Skin is warm and dry.     Findings: No erythema or rash.  Neurological:     Mental Status: She is alert and oriented to person, place, and time.     Comments: Distal sensation intact to light touch all extremities  Psychiatric:  Mood and Affect: Mood normal.        Behavior: Behavior normal.        Thought Content: Thought content normal.     Comments: Well groomed, good eye contact, normal  speech and thoughts     Results for orders placed or performed in visit on 03/01/23  H. pylori breath test   Collection Time: 03/04/23  4:38 PM  Result Value Ref Range   H pylori Breath Test Negative Negative      Assessment & Plan:   Problem List Items Addressed This Visit   None Visit Diagnoses       Annual physical exam    -  Primary     Obesity (BMI 30.0-34.9)            Updated Health Maintenance information Fasting labs done earlier today Encouraged improvement to lifestyle with diet and exercise Goal of weight loss  Obesity BMI >34 Weight Management Patient expressed desire to lose more weight. Discussed potential use of weight loss medications and the importance of diet and exercise. Patient has decreased exercise due to time constraints and is trying to adjust diet accordingly. -Consider weight loss medications. Patient to research options and check insurance coverage. See AVS -Plan to follow up in 3 months to assess progress if medication is started.  Sleep Disturbance Patient reported difficulty maintaining sleep. Discussed potential link to hormonal changes and stress. Patient has made adjustments to improve sleep hygiene. Failed Melatonin. Prefer Avoid Rx Sleep Aid -Encouraged patient to discuss potential hormonal imbalance with GYN. -Continue good sleep hygiene practices.  Cancer Screenings - Mammogram completed today with results pending - Last Pap smear was in January 2022, next due in 3-5 years. Patient to schedule next routine Pap smear visit when ready w/ [facility] - Already scheduled for Colonoscopy per GI 04/05/23  Family History of Heart Disease Maternal grandmother had a stroke and was diagnosed with heart failure in her late 23s to early 33s. Discussed optional heart scan to check arteries. -Consider heart scan CT Coronary Calcium Score - decision pending return of blood work results.     No orders of the defined types were placed in this  encounter.   No orders of the defined types were placed in this encounter.    Follow up plan: Return if symptoms worsen or fail to improve.  Saralyn Pilar, DO [facility] 04/01/2023, 3:56 PM

## 2023-04-02 LAB — CBC WITH DIFFERENTIAL/PLATELET
Absolute Lymphocytes: 2490 {cells}/uL (ref 850–3900)
Absolute Monocytes: 271 {cells}/uL (ref 200–950)
Basophils Absolute: 52 {cells}/uL (ref 0–200)
Basophils Relative: 1.2 %
Eosinophils Absolute: 30 {cells}/uL (ref 15–500)
Eosinophils Relative: 0.7 %
HCT: 36.9 % (ref 35.0–45.0)
Hemoglobin: 11.6 g/dL — ABNORMAL LOW (ref 11.7–15.5)
MCH: 28.3 pg (ref 27.0–33.0)
MCHC: 31.4 g/dL — ABNORMAL LOW (ref 32.0–36.0)
MCV: 90 fL (ref 80.0–100.0)
MPV: 10.5 fL (ref 7.5–12.5)
Monocytes Relative: 6.3 %
Neutro Abs: 1458 {cells}/uL — ABNORMAL LOW (ref 1500–7800)
Neutrophils Relative %: 33.9 %
Platelets: 316 10*3/uL (ref 140–400)
RBC: 4.1 10*6/uL (ref 3.80–5.10)
RDW: 13.4 % (ref 11.0–15.0)
Total Lymphocyte: 57.9 %
WBC: 4.3 10*3/uL (ref 3.8–10.8)

## 2023-04-02 LAB — LIPID PANEL
Cholesterol: 142 mg/dL (ref <200)
HDL: 49 mg/dL — ABNORMAL LOW (ref >=50)
LDL Cholesterol (Calc): 81 mg/dL
Non-HDL Cholesterol (Calc): 93 mg/dL (ref <130)
Total CHOL/HDL Ratio: 2.9 (calc) (ref <5.0)
Triglycerides: 41 mg/dL (ref <150)

## 2023-04-02 LAB — HEMOGLOBIN A1C
Hgb A1c MFr Bld: 5.4 %{Hb} (ref <5.7)
Mean Plasma Glucose: 108 mg/dL
eAG (mmol/L): 6 mmol/L

## 2023-04-02 LAB — COMPLETE METABOLIC PANEL WITH GFR
AG Ratio: 1.8 (calc) (ref 1.0–2.5)
ALT: 18 U/L (ref 6–29)
AST: 21 U/L (ref 10–30)
Albumin: 4.1 g/dL (ref 3.6–5.1)
Alkaline phosphatase (APISO): 32 U/L (ref 31–125)
BUN: 13 mg/dL (ref 7–25)
CO2: 26 mmol/L (ref 20–32)
Calcium: 8.5 mg/dL — ABNORMAL LOW (ref 8.6–10.2)
Chloride: 108 mmol/L (ref 98–110)
Creat: 0.61 mg/dL (ref 0.50–0.99)
Globulin: 2.3 g/dL (ref 1.9–3.7)
Glucose, Bld: 89 mg/dL (ref 65–99)
Potassium: 4.1 mmol/L (ref 3.5–5.3)
Sodium: 139 mmol/L (ref 135–146)
Total Bilirubin: 0.6 mg/dL (ref 0.2–1.2)
Total Protein: 6.4 g/dL (ref 6.1–8.1)
eGFR: 113 mL/min/{1.73_m2} (ref >=60)

## 2023-04-02 LAB — VITAMIN D 25 HYDROXY (VIT D DEFICIENCY, FRACTURES): Vit D, 25-Hydroxy: 38 ng/mL (ref 30–100)

## 2023-04-02 LAB — TSH: TSH: 1.62 m[IU]/L

## 2023-04-03 ENCOUNTER — Encounter: Payer: Self-pay | Admitting: Family Medicine

## 2023-04-05 ENCOUNTER — Ambulatory Visit
Admission: RE | Admit: 2023-04-05 | Discharge: 2023-04-05 | Disposition: A | Discharge status: Home / Self Care | Payer: 59 | Attending: Gastroenterology | Admitting: Gastroenterology

## 2023-04-05 ENCOUNTER — Ambulatory Visit: Payer: 59 | Admitting: Certified Registered"

## 2023-04-05 ENCOUNTER — Encounter: Payer: Self-pay | Admitting: Gastroenterology

## 2023-04-05 ENCOUNTER — Encounter
Admission: RE | Disposition: A | Discharge status: Home / Self Care | Payer: Self-pay | Source: Home / Self Care | Attending: Gastroenterology

## 2023-04-05 DIAGNOSIS — K5730 Diverticulosis of large intestine without perforation or abscess without bleeding: Secondary | ICD-10-CM | POA: Insufficient documentation

## 2023-04-05 DIAGNOSIS — R194 Change in bowel habit: Secondary | ICD-10-CM | POA: Diagnosis not present

## 2023-04-05 DIAGNOSIS — Z6836 Body mass index (BMI) 36.0-36.9, adult: Secondary | ICD-10-CM | POA: Insufficient documentation

## 2023-04-05 DIAGNOSIS — E66813 Obesity, class 3: Secondary | ICD-10-CM | POA: Diagnosis not present

## 2023-04-05 DIAGNOSIS — Z1211 Encounter for screening for malignant neoplasm of colon: Secondary | ICD-10-CM

## 2023-04-05 DIAGNOSIS — Z87891 Personal history of nicotine dependence: Secondary | ICD-10-CM | POA: Diagnosis not present

## 2023-04-05 HISTORY — PX: COLONOSCOPY WITH PROPOFOL: SHX5780

## 2023-04-05 SURGERY — COLONOSCOPY WITH PROPOFOL
Anesthesia: General

## 2023-04-05 MED ORDER — PROPOFOL 1000 MG/100ML IV EMUL
INTRAVENOUS | Status: AC
  Filled 2023-04-05: qty 100

## 2023-04-05 MED ORDER — PROPOFOL 500 MG/50ML IV EMUL
INTRAVENOUS | Status: DC | PRN
  Administered 2023-04-05: 150 ug/kg/min via INTRAVENOUS

## 2023-04-05 MED ORDER — LIDOCAINE HCL (CARDIAC) PF 100 MG/5ML IV SOSY
PREFILLED_SYRINGE | INTRAVENOUS | Status: DC | PRN
  Administered 2023-04-05: 50 mg via INTRAVENOUS

## 2023-04-05 MED ORDER — PROPOFOL 10 MG/ML IV BOLUS
INTRAVENOUS | Status: DC | PRN
  Administered 2023-04-05: 80 mg via INTRAVENOUS

## 2023-04-05 MED ORDER — SODIUM CHLORIDE 0.9 % IV SOLN
INTRAVENOUS | Status: DC | PRN
Start: 2023-04-05 — End: 2023-04-05

## 2023-04-05 NOTE — Transfer of Care (Signed)
Immediate Anesthesia Transfer of Care Note  Patient: Kristina Parks  Procedure(s) Performed: COLONOSCOPY WITH PROPOFOL  Patient Location: PACU and Endoscopy Unit  Anesthesia Type:General  Level of Consciousness: awake  Airway & Oxygen Therapy: Patient Spontanous Breathing  Post-op Assessment: Report given to RN and Post -op Vital signs reviewed and stable  Post vital signs: Reviewed and stable  Last Vitals:  Vitals Value Taken Time  BP 85/71 04/05/23 1044  Temp    Pulse 58 04/05/23 1044  Resp    SpO2 100 % 04/05/23 1044  Vitals shown include unfiled device data.  Last Pain:  Vitals:   04/05/23 0957  TempSrc: Temporal  PainSc: 0-No pain         Complications: No notable events documented.

## 2023-04-05 NOTE — Op Note (Signed)
[facility] Patient Name: Kiri Hinderliter Procedure Date: 04/05/2023 10:03 AM MRN: [id number] Account #: [id number] Date of Birth: [birth year] Admit Type: Outpatient Age: 43 Room: [facility] ENDO ROOM 2 Gender: Female Note Status: Finalized Instrument Name: Prentice Docker [id number] Procedure:             Colonoscopy Indications:           Change in bowel habits Providers:             Wyline Mood MD, MD Referring MD:          Smitty Cords (Referring MD) Medicines:             Monitored Anesthesia Care Complications:         No immediate complications. Procedure:             Pre-Anesthesia Assessment:                        - Prior to the procedure, a History and Physical was                         performed, and patient medications, allergies and                         sensitivities were reviewed. The patient's tolerance                         of previous anesthesia was reviewed.                        - The risks and benefits of the procedure and the                         sedation options and risks were discussed with the                         patient. All questions were answered and informed                         consent was obtained.                        - ASA Grade Assessment: II - A patient with mild                         systemic disease.                        After obtaining informed consent, the colonoscope was                         passed under direct vision. Throughout the procedure,                         the patient's blood pressure, pulse, and oxygen                         saturations were monitored continuously. The                         Colonoscope was introduced through  the anus and                         advanced to the the cecum, identified by the                         appendiceal orifice. The colonoscopy was technically                         difficult and complex due to a tortuous colon. The                          patient tolerated the procedure well. The quality of                         the bowel preparation was adequate. The ileocecal                         valve, appendiceal orifice, and rectum were                         photographed. Findings:      The perianal and digital rectal examinations were normal.      Multiple medium-mouthed diverticula were found in the sigmoid colon.      The exam was otherwise without abnormality on direct and retroflexion       views. Impression:            - Diverticulosis in the sigmoid colon.                        - The examination was otherwise normal on direct and                         retroflexion views.                        - No specimens collected. Recommendation:        - Discharge patient to home (with escort).                        - Resume previous diet.                        - Continue present medications.                        - Return to GI office as previously scheduled. Procedure Code(s):     --- Professional ---                        [phone], Colonoscopy, flexible; diagnostic, including                         collection of specimen(s) by brushing or washing, when                         performed (separate procedure) Diagnosis Code(s):     --- Professional ---                        R19.4, Change  in bowel habit                        K57.30, Diverticulosis of large intestine without                         perforation or abscess without bleeding CPT copyright 2022 American Medical Association. All rights reserved. The codes documented in this report are preliminary and upon coder review may  be revised to meet current compliance requirements. Wyline Mood, MD Wyline Mood MD, MD 04/05/2023 10:41:52 AM This report has been signed electronically. Number of Addenda: 0 Note Initiated On: 04/05/2023 10:03 AM Scope Withdrawal Time: 0 hours 9 minutes 34 seconds  Total Procedure Duration: 0 hours 14 minutes 50 seconds  Estimated  Blood Loss:  Estimated blood loss: none.      [facility]

## 2023-04-05 NOTE — Anesthesia Procedure Notes (Signed)
Procedure Name: MAC Date/Time: 04/05/2023 10:25 AM  Performed by: Cheral Bay, CRNAPre-anesthesia Checklist: Patient identified, Emergency Drugs available, Suction available, Patient being monitored and Timeout performed Patient Re-evaluated:Patient Re-evaluated prior to induction Oxygen Delivery Method: Nasal cannula Induction Type: IV induction Placement Confirmation: positive ETCO2 and CO2 detector

## 2023-04-05 NOTE — Anesthesia Postprocedure Evaluation (Signed)
Anesthesia Post Note  Patient: Kristina Parks  Procedure(s) Performed: COLONOSCOPY WITH PROPOFOL  Patient location during evaluation: PACU Anesthesia Type: General Level of consciousness: awake and awake and alert Pain management: satisfactory to patient Vital Signs Assessment: post-procedure vital signs reviewed and stable Respiratory status: spontaneous breathing Cardiovascular status: blood pressure returned to baseline Anesthetic complications: no   No notable events documented.   Last Vitals:  Vitals:   04/05/23 1044 04/05/23 1104  BP: 95/71 104/66  Pulse:  (!) 55  Resp: 16 18  Temp: (!) 35.7 C   SpO2:  100%    Last Pain:  Vitals:   04/05/23 1104  TempSrc:   PainSc: 0-No pain                 VAN STAVEREN,Jaymere Alen

## 2023-04-05 NOTE — H&P (Signed)
Wyline Mood, MD [address], [address], [place], [place], [address] [address], [place], [place], [address] Phone: [phone]  Fax: [phone]  Primary Care Physician:  Smitty Cords, DO   Pre-Procedure History & Physical: HPI:  Kristina Parks is a 43 y.o. female is here for an colonoscopy.   Past Medical History:  Diagnosis Date   Diverticulosis    Fibroids     Past Surgical History:  Procedure Laterality Date   ABDOMINOPLASTY  2020   CESAREAN SECTION  1999   2003, 2007   CHOLECYSTECTOMY  2012    Prior to Admission medications   Medication Sig Start Date End Date Taking? Authorizing Provider  Biotin 2.5 MG TABS Take by mouth.    [provider]  cholecalciferol (VITAMIN D3) 25 MCG (1000 UNIT) tablet Take 1,000 Units by mouth daily.    [provider]  Cyanocobalamin (VITAMIN B 12 PO) Take by mouth.    [provider]  doxycycline (VIBRAMYCIN) 50 MG capsule Take 1 capsule (50 mg total) by mouth daily. Take with dinner meal 02/20/23   Deirdre Evener, MD  ketoconazole (NIZORAL) 2 % shampoo Shampoo into scalp let sit 5-10 minutes then wash out. Use 3d/wk. 02/20/23   Deirdre Evener, MD  MAGNESIUM CITRATE PO Take 250 mg by mouth.    [provider]  minoxidil (LONITEN) 2.5 MG tablet Take 1/2 tab po QD. 02/20/23   Deirdre Evener, MD  mometasone (ELOCON) 0.1 % lotion Apply to the scalp QD 5d/wk. 02/20/23   Deirdre Evener, MD  Probiotic Product (PROBIOTIC DAILY PO) Take by mouth.    [provider]    Allergies as of 02/20/2023 - Review Complete 02/20/2023  Allergen Reaction Noted   Lamisil [terbinafine] Other (See Comments) 05/09/2021    Family History  Problem Relation Age of Onset   Diabetes Sister    Hypertension Sister    Diabetes Maternal Grandmother    Stroke Maternal Grandmother    Hypertension Maternal Grandmother    Congestive Heart Failure Maternal Grandmother 50 - 59    Cervical cancer Neg Hx    Breast cancer Neg Hx    Colon cancer Neg Hx     Social History   Socioeconomic History   Marital status: Widowed    Spouse name: Not on file   Number of children: Not on file   Years of education: Not on file   Highest education level: Master's degree (e.g., MA, MS, MEng, MEd, MSW, MBA)  Occupational History   Not on file  Tobacco Use   Smoking status: Former    Current packs/day: 1.00    Average packs/day: 1 pack/day for 10.0 years (10.0 ttl pk-yrs)    Types: Cigarettes   Smokeless tobacco: Former  Building services engineer status: Never Used  Substance and Sexual Activity   Alcohol use: Never   Drug use: Not Currently    Types: Marijuana   Sexual activity: Not on file  Other Topics Concern   Not on file  Social History Narrative   Not on file   Social Drivers of Health   Financial Resource Strain: Low Risk  (04/01/2023)   Overall Financial Resource Strain (CARDIA)    Difficulty of Paying Living Expenses: Not hard at all  Food Insecurity: No Food Insecurity (04/01/2023)   Hunger Vital Sign    Worried About Running Out of Food in the Last Year: Never true    Ran  Out of Food in the Last Year: Never true  Transportation Needs: No Transportation Needs (04/01/2023)   PRAPARE - Administrator, Civil Service (Medical): No    Lack of Transportation (Non-Medical): No  Physical Activity: Sufficiently Active (04/01/2023)   Exercise Vital Sign    Days of Exercise per Week: 6 days    Minutes of Exercise per Session: 40 min  Stress: Stress Concern Present (04/01/2023)   [employer] of Occupational Health - Occupational Stress Questionnaire    Feeling of Stress : To some extent  Social Connections: Moderately Isolated (04/01/2023)   Social Connection and Isolation Panel [NHANES]    Frequency of Communication with Friends and Family: Once a week    Frequency of Social Gatherings with Friends and Family: Never    Attends Religious Services: More  than 4 times per year    Active Member of [employer] or Organizations: Yes    Attends Banker Meetings: More than 4 times per year    Marital Status: Widowed  Intimate Partner Violence: Not on file    Review of Systems: See HPI, otherwise negative ROS  Physical Exam: LMP 03/12/2023  General:   Alert,  pleasant and cooperative in NAD Head:  Normocephalic and atraumatic. Neck:  Supple; no masses or thyromegaly. Lungs:  Clear throughout to auscultation, normal respiratory effort.    Heart:  +S1, +S2, Regular rate and rhythm, No edema. Abdomen:  Soft, nontender and nondistended. Normal bowel sounds, without guarding, and without rebound.   Neurologic:  Alert and  oriented x4;  grossly normal neurologically.  Impression/Plan: Kristina Parks is here for an colonoscopy to be performed for change in bowel habits Risks, benefits, limitations, and alternatives regarding  colonoscopy have been reviewed with the patient.  Questions have been answered.  All parties agreeable.   Wyline Mood, MD  04/05/2023, 9:29 AM

## 2023-04-05 NOTE — Anesthesia Preprocedure Evaluation (Signed)
Anesthesia Evaluation  Patient identified by MRN, date of birth, ID band Patient awake    Reviewed: Allergy & Precautions, NPO status , Patient's Chart, lab work & pertinent test results  Airway Mallampati: II  TM Distance: >3 FB Neck ROM: Full    Dental  (+) Teeth Intact   Pulmonary neg pulmonary ROS, Patient abstained from smoking., former smoker   Pulmonary exam normal breath sounds clear to auscultation       Cardiovascular Exercise Tolerance: Good negative cardio ROS Normal cardiovascular exam Rhythm:Regular Rate:Normal     Neuro/Psych negative neurological ROS  negative psych ROS   GI/Hepatic negative GI ROS, Neg liver ROS,,,  Endo/Other  negative endocrine ROS  Class 3 obesity  Renal/GU negative Renal ROS  negative genitourinary   Musculoskeletal   Abdominal  (+) + obese  Peds negative pediatric ROS (+)  Hematology negative hematology ROS (+)   Anesthesia Other Findings Past Medical History: No date: Diverticulosis No date: Fibroids  Past Surgical History: 2020: ABDOMINOPLASTY 1999: CESAREAN SECTION     Comment:  2003, 2007 2012: CHOLECYSTECTOMY  BMI    Body Mass Index: 36.79 kg/m      Reproductive/Obstetrics negative OB ROS                             Anesthesia Physical Anesthesia Plan  ASA: 2  Anesthesia Plan: General   Post-op Pain Management:    Induction: Intravenous  PONV Risk Score and Plan: Propofol infusion and TIVA  Airway Management Planned: Natural Airway and Nasal Cannula  Additional Equipment:   Intra-op Plan:   Post-operative Plan:   Informed Consent: I have reviewed the patients History and Physical, chart, labs and discussed the procedure including the risks, benefits and alternatives for the proposed anesthesia with the patient or authorized representative who has indicated his/her understanding and acceptance.     Dental Advisory  Given  Plan Discussed with: CRNA  Anesthesia Plan Comments:        Anesthesia Quick Evaluation

## 2023-04-08 ENCOUNTER — Encounter: Payer: Self-pay | Admitting: Gastroenterology

## 2023-05-22 ENCOUNTER — Ambulatory Visit: Payer: Medicaid Other | Admitting: Dermatology

## 2023-06-03 ENCOUNTER — Ambulatory Visit: Admitting: Dermatology
